# Patient Record
Sex: Female | Born: 1935 | Race: White | Hispanic: No | Smoking: Never smoker
Health system: Southern US, Community
[De-identification: ages and names within clinical notes are randomized; demographics above are authoritative.]

## PROBLEM LIST (undated history)

## (undated) DIAGNOSIS — Z923 Personal history of irradiation: Secondary | ICD-10-CM

## (undated) DIAGNOSIS — D0510 Intraductal carcinoma in situ of unspecified breast: Principal | ICD-10-CM

## (undated) DIAGNOSIS — T4145XA Adverse effect of unspecified anesthetic, initial encounter: Secondary | ICD-10-CM

## (undated) DIAGNOSIS — Z8719 Personal history of other diseases of the digestive system: Secondary | ICD-10-CM

## (undated) DIAGNOSIS — R911 Solitary pulmonary nodule: Secondary | ICD-10-CM

## (undated) DIAGNOSIS — J189 Pneumonia, unspecified organism: Secondary | ICD-10-CM

## (undated) DIAGNOSIS — C3490 Malignant neoplasm of unspecified part of unspecified bronchus or lung: Secondary | ICD-10-CM

## (undated) DIAGNOSIS — M1990 Unspecified osteoarthritis, unspecified site: Secondary | ICD-10-CM

## (undated) DIAGNOSIS — Z8672 Personal history of thrombophlebitis: Secondary | ICD-10-CM

## (undated) DIAGNOSIS — K222 Esophageal obstruction: Secondary | ICD-10-CM

## (undated) DIAGNOSIS — F419 Anxiety disorder, unspecified: Secondary | ICD-10-CM

## (undated) DIAGNOSIS — T8859XA Other complications of anesthesia, initial encounter: Secondary | ICD-10-CM

## (undated) DIAGNOSIS — Z9889 Other specified postprocedural states: Secondary | ICD-10-CM

## (undated) DIAGNOSIS — K219 Gastro-esophageal reflux disease without esophagitis: Secondary | ICD-10-CM

## (undated) DIAGNOSIS — I10 Essential (primary) hypertension: Secondary | ICD-10-CM

## (undated) DIAGNOSIS — J449 Chronic obstructive pulmonary disease, unspecified: Secondary | ICD-10-CM

## (undated) DIAGNOSIS — R112 Nausea with vomiting, unspecified: Secondary | ICD-10-CM

## (undated) DIAGNOSIS — I6529 Occlusion and stenosis of unspecified carotid artery: Secondary | ICD-10-CM

## (undated) DIAGNOSIS — N390 Urinary tract infection, site not specified: Secondary | ICD-10-CM

## (undated) HISTORY — PX: BREAST CYST EXCISION: SHX579

## (undated) HISTORY — DX: Essential (primary) hypertension: I10

## (undated) HISTORY — DX: Personal history of other diseases of the digestive system: Z87.19

## (undated) HISTORY — DX: Personal history of thrombophlebitis: Z86.72

## (undated) HISTORY — PX: KNEE ARTHROSCOPY: SUR90

## (undated) HISTORY — PX: CATARACT EXTRACTION: SUR2

## (undated) HISTORY — DX: Esophageal obstruction: K22.2

## (undated) HISTORY — DX: Chronic obstructive pulmonary disease, unspecified: J44.9

## (undated) HISTORY — DX: Unspecified osteoarthritis, unspecified site: M19.90

## (undated) HISTORY — DX: Intraductal carcinoma in situ of unspecified breast: D05.10

## (undated) HISTORY — DX: Personal history of irradiation: Z92.3

## (undated) HISTORY — PX: CYSTECTOMY: SUR359

## (undated) HISTORY — DX: Gastro-esophageal reflux disease without esophagitis: K21.9

## (undated) HISTORY — DX: Anxiety disorder, unspecified: F41.9

## (undated) HISTORY — PX: EYE SURGERY: SHX253

---

## 2005-12-27 ENCOUNTER — Ambulatory Visit: Payer: Self-pay | Admitting: Internal Medicine

## 2006-01-04 ENCOUNTER — Ambulatory Visit: Payer: Self-pay | Admitting: Internal Medicine

## 2006-02-13 ENCOUNTER — Ambulatory Visit: Payer: Self-pay | Admitting: Internal Medicine

## 2006-05-22 ENCOUNTER — Encounter (INDEPENDENT_AMBULATORY_CARE_PROVIDER_SITE_OTHER): Payer: Self-pay | Admitting: Diagnostic Radiology

## 2006-05-22 ENCOUNTER — Encounter (INDEPENDENT_AMBULATORY_CARE_PROVIDER_SITE_OTHER): Payer: Self-pay | Admitting: Specialist

## 2006-05-22 ENCOUNTER — Encounter: Admission: RE | Admit: 2006-05-22 | Discharge: 2006-05-22 | Payer: Self-pay | Admitting: Internal Medicine

## 2006-06-06 ENCOUNTER — Encounter: Admission: RE | Admit: 2006-06-06 | Discharge: 2006-06-06 | Payer: Self-pay | Admitting: Internal Medicine

## 2006-07-04 ENCOUNTER — Encounter: Admission: RE | Admit: 2006-07-04 | Discharge: 2006-07-04 | Payer: Self-pay | Admitting: Surgery

## 2006-07-04 ENCOUNTER — Ambulatory Visit (HOSPITAL_COMMUNITY): Admission: RE | Admit: 2006-07-04 | Discharge: 2006-07-04 | Payer: Self-pay | Admitting: Surgery

## 2006-07-04 ENCOUNTER — Encounter (INDEPENDENT_AMBULATORY_CARE_PROVIDER_SITE_OTHER): Payer: Self-pay | Admitting: Surgery

## 2006-07-13 ENCOUNTER — Ambulatory Visit: Payer: Self-pay | Admitting: Oncology

## 2006-07-24 ENCOUNTER — Ambulatory Visit: Admission: RE | Admit: 2006-07-24 | Discharge: 2006-09-27 | Payer: Self-pay | Admitting: Radiation Oncology

## 2006-08-09 LAB — COMPREHENSIVE METABOLIC PANEL
ALT: 18 U/L (ref 0–35)
CO2: 24 mEq/L (ref 19–32)
Calcium: 9 mg/dL (ref 8.4–10.5)
Chloride: 106 mEq/L (ref 96–112)
Glucose, Bld: 111 mg/dL — ABNORMAL HIGH (ref 70–99)
Sodium: 141 mEq/L (ref 135–145)
Total Bilirubin: 0.3 mg/dL (ref 0.3–1.2)
Total Protein: 6.8 g/dL (ref 6.0–8.3)

## 2006-08-09 LAB — CBC WITH DIFFERENTIAL/PLATELET
BASO%: 0.8 % (ref 0.0–2.0)
MCHC: 35.3 g/dL (ref 32.0–36.0)
MONO#: 0.6 10*3/uL (ref 0.1–0.9)
RBC: 3.89 10*6/uL (ref 3.70–5.32)
lymph#: 1.9 10*3/uL (ref 0.9–3.3)

## 2006-08-30 ENCOUNTER — Ambulatory Visit: Payer: Self-pay | Admitting: Oncology

## 2006-11-28 ENCOUNTER — Ambulatory Visit: Payer: Self-pay | Admitting: Oncology

## 2007-01-15 ENCOUNTER — Ambulatory Visit: Payer: Self-pay | Admitting: Oncology

## 2007-02-27 ENCOUNTER — Ambulatory Visit: Payer: Self-pay | Admitting: Oncology

## 2007-07-25 ENCOUNTER — Encounter: Admission: RE | Admit: 2007-07-25 | Discharge: 2007-07-25 | Payer: Self-pay | Admitting: Orthopedic Surgery

## 2007-08-07 ENCOUNTER — Encounter: Admission: RE | Admit: 2007-08-07 | Discharge: 2007-08-07 | Payer: Self-pay | Admitting: Orthopedic Surgery

## 2007-12-03 ENCOUNTER — Emergency Department (HOSPITAL_BASED_OUTPATIENT_CLINIC_OR_DEPARTMENT_OTHER): Admission: EM | Admit: 2007-12-03 | Discharge: 2007-12-03 | Payer: Self-pay | Admitting: Emergency Medicine

## 2008-03-30 ENCOUNTER — Telehealth: Payer: Self-pay | Admitting: Internal Medicine

## 2008-05-01 ENCOUNTER — Encounter: Admission: RE | Admit: 2008-05-01 | Discharge: 2008-05-01 | Payer: Self-pay | Admitting: Surgery

## 2008-05-14 DIAGNOSIS — F411 Generalized anxiety disorder: Secondary | ICD-10-CM | POA: Insufficient documentation

## 2008-05-14 DIAGNOSIS — J4489 Other specified chronic obstructive pulmonary disease: Secondary | ICD-10-CM | POA: Insufficient documentation

## 2008-05-14 DIAGNOSIS — K222 Esophageal obstruction: Secondary | ICD-10-CM

## 2008-05-14 DIAGNOSIS — Z8719 Personal history of other diseases of the digestive system: Secondary | ICD-10-CM

## 2008-05-14 DIAGNOSIS — J449 Chronic obstructive pulmonary disease, unspecified: Secondary | ICD-10-CM

## 2008-05-14 DIAGNOSIS — K219 Gastro-esophageal reflux disease without esophagitis: Secondary | ICD-10-CM

## 2008-05-14 DIAGNOSIS — M810 Age-related osteoporosis without current pathological fracture: Secondary | ICD-10-CM | POA: Insufficient documentation

## 2008-05-14 DIAGNOSIS — M129 Arthropathy, unspecified: Secondary | ICD-10-CM | POA: Insufficient documentation

## 2008-05-14 DIAGNOSIS — I10 Essential (primary) hypertension: Secondary | ICD-10-CM

## 2008-05-14 DIAGNOSIS — I809 Phlebitis and thrombophlebitis of unspecified site: Secondary | ICD-10-CM

## 2008-05-18 ENCOUNTER — Ambulatory Visit: Payer: Self-pay | Admitting: Internal Medicine

## 2008-07-01 ENCOUNTER — Encounter: Payer: Self-pay | Admitting: Internal Medicine

## 2009-05-05 ENCOUNTER — Encounter: Admission: RE | Admit: 2009-05-05 | Discharge: 2009-05-05 | Payer: Self-pay | Admitting: Surgery

## 2009-12-19 ENCOUNTER — Emergency Department (HOSPITAL_BASED_OUTPATIENT_CLINIC_OR_DEPARTMENT_OTHER): Admission: EM | Admit: 2009-12-19 | Discharge: 2009-12-19 | Payer: Self-pay | Admitting: Emergency Medicine

## 2010-04-12 LAB — BASIC METABOLIC PANEL
BUN: 15 mg/dL (ref 6–23)
CO2: 27 mEq/L (ref 19–32)
Chloride: 103 mEq/L (ref 96–112)
GFR calc Af Amer: >60 mL/min (ref >60)
Glucose, Bld: 106 mg/dL — ABNORMAL HIGH (ref 70–99)
Potassium: 3.5 mEq/L (ref 3.5–5.1)
Sodium: 143 mEq/L (ref 135–145)

## 2010-04-12 LAB — DIFFERENTIAL
Basophils Relative: 1 % (ref 0–1)
Lymphocytes Relative: 27 % (ref 12–46)
Monocytes Absolute: 0.6 10*3/uL (ref 0.1–1.0)
Monocytes Relative: 10 % (ref 3–12)
Neutrophils Relative %: 60 % (ref 43–77)

## 2010-04-12 LAB — URINALYSIS, ROUTINE W REFLEX MICROSCOPIC
Bilirubin Urine: NEGATIVE
Glucose, UA: NEGATIVE mg/dL
Leukocytes, UA: NEGATIVE
Nitrite: NEGATIVE
Protein, ur: NEGATIVE mg/dL
pH: 7 (ref 5.0–8.0)

## 2010-04-12 LAB — CBC
MCHC: 34.6 g/dL (ref 30.0–36.0)
Platelets: 224 10*3/uL (ref 150–400)
RDW: 13.3 % (ref 11.5–15.5)

## 2010-04-12 LAB — URINE MICROSCOPIC-ADD ON

## 2010-04-12 LAB — POCT CARDIAC MARKERS: CKMB, poc: <1 ng/mL — ABNORMAL LOW (ref 1.0–8.0)

## 2010-05-10 ENCOUNTER — Other Ambulatory Visit: Payer: Self-pay | Admitting: Internal Medicine

## 2010-05-10 DIAGNOSIS — Z1231 Encounter for screening mammogram for malignant neoplasm of breast: Secondary | ICD-10-CM

## 2010-05-12 ENCOUNTER — Other Ambulatory Visit: Payer: Self-pay | Admitting: Internal Medicine

## 2010-05-20 ENCOUNTER — Other Ambulatory Visit: Payer: Self-pay | Admitting: Internal Medicine

## 2010-05-20 MED ORDER — ESOMEPRAZOLE MAGNESIUM 40 MG PO CPDR: 40.0000 mg | DELAYED_RELEASE_CAPSULE | Freq: Every day | ORAL | Status: DC

## 2010-05-20 NOTE — Telephone Encounter (Signed)
Rx sent 

## 2010-05-26 ENCOUNTER — Ambulatory Visit
Admission: RE | Admit: 2010-05-26 | Discharge: 2010-05-26 | Disposition: A | Discharge status: Home / Self Care | Payer: Medicare Other | Source: Ambulatory Visit | Attending: Internal Medicine | Admitting: Internal Medicine

## 2010-05-26 DIAGNOSIS — Z1231 Encounter for screening mammogram for malignant neoplasm of breast: Secondary | ICD-10-CM

## 2010-06-07 ENCOUNTER — Encounter (INDEPENDENT_AMBULATORY_CARE_PROVIDER_SITE_OTHER): Payer: Self-pay | Admitting: Surgery

## 2010-06-14 NOTE — Op Note (Signed)
NAME:  [name], [name]              ACCOUNT NO.:  [id number]   MEDICAL RECORD NO.:  [id number]          PATIENT TYPE:  AMB   LOCATION:  SDS                          FACILITY:  [facility]   PHYSICIAN:  Wilmon Arms. Corliss Skains, M.D. DATE OF BIRTH:  1929/06/20   DATE OF PROCEDURE:  07/04/2006  DATE OF DISCHARGE:                               OPERATIVE REPORT   PREOPERATIVE DIAGNOSIS:  1. Left breast ductal carcinoma in situ.  2. Right breast atypical cells.   POSTOPERATIVE DIAGNOSIS:  1. Left breast ductal carcinoma in situ.  2. Right breast atypical cells.   PROCEDURE PERFORMED:  Bilateral needle localized partial mastectomy.   SURGEON:  Wilmon Arms. Tsuei, M.D.   ANESTHESIA:  General.   INDICATIONS:  The patient is a healthy 77 year old female with a strong  family history of breast cancer.  The patient had mammograms this year  which had bilateral suspicious findings.  She underwent bilateral  stereotactic biopsies.  The left breast biopsy showed ductal carcinoma  in situ, ER and PR positive.  The right breast biopsy showed a complex  sclerosing lesion with microcalcifications with atypia.  The MRI showed  no other significant findings.  She now presents for bilateral needle  localized lumpectomies.   DESCRIPTION OF PROCEDURE:  The patient was brought to the operating room  and placed in the supine position on the operating table.  Wires had  been placed by radiology preop.  After an adequate level of general  anesthesia was obtained, the patient's breasts were prepped with  Betadine and draped in a sterile fashion.  Her bed was tilted to her  right.  The left wire was placed in the far lateral position.  An  elliptical incision was made around the wire.  Skin flaps were raised.  We had to tunnel deeply towards the center of the breast to follow the  wire.  A large cylinder of tissue was taken around the wire.  Once we  were safely past the tip of the wire, the specimen was amputated  and  oriented with a long suture lateral and a short suture superior.  The  specimen was sent for specimen mammogram.  The wound was packed with a  sponge.   We then turned our attention to the right side.  The patient was tilted  to her left.  An elliptical incision was made around the wire and skin  flaps were raised with cautery.  We removed a cylinder of tissue around  the wire down to the fascia.  At this point, we received a telephone  report that the left sided specimen mammogram showed the biopsy clip in  place.  Once we had removed the right sided specimen, it was oriented  with a long suture lateral and a short suture superior.  Hemostasis was  obtained with cautery.  This wound was also packed with a sponge.  I  then closed the left sided incision while we were waiting for the phone  report on the right.  The wound was closed with a deep layer of 3-0  Vicryl and a subcuticular layer  of 4-0 Monocryl.  The right sided lesion  was closed in a similar fashion.  We then received a telephone report that the right specimen also showed  the biopsy clip in the middle.  Steri-Strips and clean dressings were  applied.  The patient was extubated and brought to recovery in stable  condition.  All sponge, instrument, and needle counts were correct.      Wilmon Arms. Tsuei, M.D.  Electronically Signed     MKT/MEDQ  D:  07/04/2006  T:  07/04/2006  Job:  [id number]

## 2010-06-17 NOTE — Assessment & Plan Note (Signed)
[facility]                         GASTROENTEROLOGY OFFICE NOTE   [name], [name]                       MRN:          [id number]  DATE:12/27/2005                            DOB:          1930/01/15    [name] is a delightful 77 year old white female who is here today  with progressive solid food dysphagia.  She has for the past 3 to 4  months noticed difficulty in swallowing, especially steaks and meat.  She had one episode of having to run to the bathroom to regurgitate the  food which was stuck in her esophagus.  She denies any dysphagia to  liquids.  She denies odynophagia or hematemesis.  The patient has a  history of benign distal esophageal stricture which was dilated on one  previous occasion in 2004 by Dr. Bascom Levels in [facility].  She feels that  she is due for repeat dilatation.  She also has a history of  diverticulosis, flexible sigmoidoscopy and barium enema about 3 years  ago, was unremarkable.   PAST MEDICAL HISTORY:  1. High blood pressure.  2. Arthritis.  3. Allergies.   OPERATIONS:  None.   FAMILY HISTORY:  Uterine cancer in sister, pancreatic cancer in husband,  breast cancer in two sisters.   SOCIAL HISTORY:  She is widowed.  She works as a Nutritional therapist.  She does not smoke and drinks a glass of wine a day.   REVIEW OF SYSTEMS:  Positive for eyeglasses, allergies, frequent cough,  urination changes, arthritic complaints, back pain, leakage of urine.   PHYSICAL EXAMINATION:  Blood pressure 194/62, pulse 112, weight 111  pounds.  She was alert, oriented, in no distress, somewhat nervous.  HEENT:  Sclerae is nonicteric.  She has spider nevi in her face.  LUNGS:  Clear to auscultation.  CARDIAC:  Cor with rapid S1 and S2, no murmur.  ABDOMEN:  Soft, nontender with normal active bowel sounds, no  tenderness.  Liver edge at the costal margin, no ascites.  RECTAL:  Soft, Hemoccult-negative stool.  EXTREMITIES:   No edema.   IMPRESSION:  [age]-year-old white female with progressive solid  food dysphagia suggestive of recurrent distal esophageal stricture, rule  out Barrett's esophagus.  The patient had previous dilatation 4 years  ago.   PLAN:  1. Switch from Protonix to Nexium 40 mg a day at patient's request.  2. Schedule upper endoscopy with possible esophageal dilation.  3. Antireflux measures.     Hedwig Morton. Juanda Chance, MD  Electronically Signed    DMB/MedQ  DD: 12/27/2005  DT: 12/28/2005  Job #: [id number]   cc:   Newt Minion

## 2010-06-17 NOTE — Assessment & Plan Note (Signed)
 [facility]                         GASTROENTEROLOGY OFFICE NOTE   [name], [name]                       MRN:          [id number]  DATE:02/13/2006                            DOB:          [birth year]    [name] is a 77 year old white female found to have a benign distal  esophageal stricture on upper endoscopy on January 04, 2006.  She  underwent upper endoscopy with esophageal dilatation to 16 mm or 48  French using Savary dilators.  She was then switched to Nexium 40 mg a  day.  She is currently quite satisfied with her progress.  There has  been no dysphagia to solids or liquids.  Nexium seems to be controlling  her symptoms of reflux.  She still has occasional dry cough.   PHYSICAL EXAMINATION:  Blood pressure 150/78, pulse 72, weight 112  pounds.   We have discussed treatment of gastroesophageal reflux.  She is getting  adequate therapy combined with antireflux measures.  She may not need  esophageal dilatation again.  I have written her a prescription for  Nexium 90-day supply with 3 refills.  I will be happy to see her in the  future for other GI issues.  She is up to date on flexible sigmoidoscopy  and barium enema, which was done in the past few years.     Hedwig Morton. Juanda Chance, MD  Electronically Signed    DMB/MedQ  DD: 02/13/2006  DT: 02/14/2006  Job #: [id number]   cc:   Newt Minion

## 2010-07-06 ENCOUNTER — Encounter: Payer: Self-pay | Admitting: Internal Medicine

## 2010-07-06 ENCOUNTER — Ambulatory Visit (INDEPENDENT_AMBULATORY_CARE_PROVIDER_SITE_OTHER): Payer: Medicare Other | Admitting: Internal Medicine

## 2010-07-06 VITALS — BP 162/78 | HR 96 | Ht 60.0 in | Wt 106.2 lb

## 2010-07-06 DIAGNOSIS — K219 Gastro-esophageal reflux disease without esophagitis: Secondary | ICD-10-CM

## 2010-07-06 DIAGNOSIS — Z1211 Encounter for screening for malignant neoplasm of colon: Secondary | ICD-10-CM

## 2010-07-06 MED ORDER — ESOMEPRAZOLE MAGNESIUM 40 MG PO CPDR: 40.0000 mg | DELAYED_RELEASE_CAPSULE | Freq: Every day | ORAL | Status: DC

## 2010-07-06 NOTE — Progress Notes (Signed)
[name] 10-28-1929 MRN [id number]      History of Present Illness:  This is a 77 year old white female with chronic gastroesophageal reflux and an esophageal stricture. She is here for a refill of her Nexium. She is asymptomatic. Her last upper endoscopy with dilatation was in December 2007 using 16 mm Savary dilators. She was in the past on Prevacid and Protonix. She had a flexible sigmoidoscopy and subsequent barium enema in 2000. She is due for a recall colonoscopy. She denies any lower GI symptoms.   Past Medical History  Diagnosis Date  . History of gastritis   . History of phlebitis   . Anxiety disorder   . Osteoporosis   . COPD (chronic obstructive pulmonary disease)   . Arthritis   . Hypertension   . GERD (gastroesophageal reflux disease)   . Esophageal stricture    Past Surgical History  Procedure Date  . Cataract extraction     bilateral  . Knee arthroscopy     right  . Breast cyst excision     left    reports that she has never smoked. She has never used smokeless tobacco. She reports that she drinks alcohol. She reports that she does not use illicit drugs. family history includes Breast cancer in her sister and Uterine cancer in an unspecified family member.  There is no history of Colon cancer. Allergies  Allergen Reactions  . Propoxyphene N-Acetaminophen   . Sulfonamide Derivatives   . Sulfur         Review of Systems: Negative for dysphagia or odynophagia. Negative for chest pain or shortness of breath  The remainder of the 10  point ROS is negative except as outlined in H&P    Assessment and Plan:  Problem #1 gastroesophageal reflux disease. Patient is asymptomatic and well-controlled on Nexium 40 mg daily. She is to continue antireflux measures.  Problem #2 colorectal screening. Patient is due for a colonoscopy. We will send a recall letter in the fall of 2012.   07/06/2010 Lina Sar

## 2010-07-06 NOTE — Patient Instructions (Addendum)
We have sent Nexium prescription to your pharmacy. CC:Dr Guerry Bruin

## 2010-11-01 LAB — RAPID STREP SCREEN (MED CTR MEBANE ONLY): Streptococcus, Group A Screen (Direct): NEGATIVE

## 2011-05-09 ENCOUNTER — Other Ambulatory Visit (INDEPENDENT_AMBULATORY_CARE_PROVIDER_SITE_OTHER): Payer: Self-pay | Admitting: Surgery

## 2011-05-09 DIAGNOSIS — Z1231 Encounter for screening mammogram for malignant neoplasm of breast: Secondary | ICD-10-CM

## 2011-05-12 ENCOUNTER — Encounter (INDEPENDENT_AMBULATORY_CARE_PROVIDER_SITE_OTHER): Payer: Self-pay | Admitting: Surgery

## 2011-05-12 ENCOUNTER — Ambulatory Visit (INDEPENDENT_AMBULATORY_CARE_PROVIDER_SITE_OTHER): Payer: Medicare Other | Admitting: Surgery

## 2011-05-12 VITALS — BP 150/94 | HR 86 | Temp 98.6°F | Resp 22 | Ht 60.0 in | Wt 107.2 lb

## 2011-05-12 DIAGNOSIS — D0510 Intraductal carcinoma in situ of unspecified breast: Secondary | ICD-10-CM

## 2011-05-12 DIAGNOSIS — D0590 Unspecified type of carcinoma in situ of unspecified breast: Secondary | ICD-10-CM

## 2011-05-12 HISTORY — DX: Intraductal carcinoma in situ of unspecified breast: D05.10

## 2011-05-12 NOTE — Progress Notes (Signed)
The patient is here for annual follow-up of her left breast DCIS.  She is status post bilateral needle-localized lumpectomies in 2008.  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  She completed radiation therapy and was unable to tolerate Tamoxifen.  She is due for a mammogram in a couple of weeks.  No changes to her health status.  Filed Vitals:   05/12/11 1454  BP: 150/94  Pulse: 86  Temp: 98.6 F (37 C)  Resp: 22   Breasts - both incisions are faint and barely visible; no palpable masses in either breast.  No axillary lymphadenopathy.  Bilateral nipples are chronically retracted and these are unchanged.  We will continue with annual mammograms and annual breast examinations.  Follow-up 1 year.  Wilmon Arms. Corliss Skains, MD, [facility] Surgery  05/12/2011 3:32 PM

## 2011-05-29 ENCOUNTER — Other Ambulatory Visit (INDEPENDENT_AMBULATORY_CARE_PROVIDER_SITE_OTHER): Payer: Self-pay | Admitting: Surgery

## 2011-05-29 ENCOUNTER — Ambulatory Visit
Admission: RE | Admit: 2011-05-29 | Discharge: 2011-05-29 | Disposition: A | Discharge status: Home / Self Care | Payer: Medicare Other | Source: Ambulatory Visit | Attending: Surgery | Admitting: Surgery

## 2011-05-29 DIAGNOSIS — Z1231 Encounter for screening mammogram for malignant neoplasm of breast: Secondary | ICD-10-CM

## 2011-07-15 ENCOUNTER — Other Ambulatory Visit: Payer: Self-pay | Admitting: Internal Medicine

## 2012-04-10 ENCOUNTER — Encounter (INDEPENDENT_AMBULATORY_CARE_PROVIDER_SITE_OTHER): Payer: Self-pay | Admitting: Surgery

## 2012-05-27 ENCOUNTER — Other Ambulatory Visit (INDEPENDENT_AMBULATORY_CARE_PROVIDER_SITE_OTHER): Payer: Self-pay | Admitting: Surgery

## 2012-05-27 DIAGNOSIS — Z853 Personal history of malignant neoplasm of breast: Secondary | ICD-10-CM

## 2012-05-31 ENCOUNTER — Encounter (INDEPENDENT_AMBULATORY_CARE_PROVIDER_SITE_OTHER): Payer: Self-pay | Admitting: Surgery

## 2012-05-31 ENCOUNTER — Ambulatory Visit (INDEPENDENT_AMBULATORY_CARE_PROVIDER_SITE_OTHER): Payer: Medicare Other | Admitting: Surgery

## 2012-05-31 VITALS — BP 134/74 | HR 97 | Temp 98.6°F | Resp 18 | Ht 62.0 in | Wt 110.0 lb

## 2012-05-31 DIAGNOSIS — D0590 Unspecified type of carcinoma in situ of unspecified breast: Secondary | ICD-10-CM

## 2012-05-31 DIAGNOSIS — D0512 Intraductal carcinoma in situ of left breast: Secondary | ICD-10-CM

## 2012-05-31 NOTE — Progress Notes (Signed)
The patient is here for annual follow-up of her left breast DCIS. She is status post bilateral needle-localized lumpectomies in 2008. 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 She completed radiation therapy and was unable to tolerate Tamoxifen. She is due for a mammogram next week.   No changes to her health status.                     Filed Vitals:   05/31/12 0957  BP: 134/74  Pulse: 97  Temp: 98.6 F (37 C)  Resp: 18    Breasts - both incisions are faint and barely visible; no palpable masses in either breast. No axillary lymphadenopathy. Bilateral nipples are chronically retracted and these are unchanged.  We will continue with annual mammograms and annual breast examinations.  Follow-up 1 year.  Wilmon Arms. Corliss Skains, MD, [facility] Surgery  05/31/2012 10:39 AM

## 2012-06-07 ENCOUNTER — Ambulatory Visit
Admission: RE | Admit: 2012-06-07 | Discharge: 2012-06-07 | Disposition: A | Discharge status: Home / Self Care | Payer: Medicare Other | Source: Ambulatory Visit | Attending: Surgery | Admitting: Surgery

## 2012-06-07 DIAGNOSIS — Z853 Personal history of malignant neoplasm of breast: Secondary | ICD-10-CM

## 2012-07-12 ENCOUNTER — Other Ambulatory Visit: Payer: Self-pay | Admitting: Internal Medicine

## 2012-08-13 ENCOUNTER — Other Ambulatory Visit: Payer: Self-pay | Admitting: Dermatology

## 2013-06-02 ENCOUNTER — Encounter (INDEPENDENT_AMBULATORY_CARE_PROVIDER_SITE_OTHER): Payer: Self-pay | Admitting: Surgery

## 2013-06-10 ENCOUNTER — Other Ambulatory Visit (INDEPENDENT_AMBULATORY_CARE_PROVIDER_SITE_OTHER): Payer: Self-pay | Admitting: Surgery

## 2013-06-10 DIAGNOSIS — Z853 Personal history of malignant neoplasm of breast: Secondary | ICD-10-CM

## 2013-06-13 ENCOUNTER — Other Ambulatory Visit: Payer: Self-pay

## 2013-06-13 ENCOUNTER — Other Ambulatory Visit (INDEPENDENT_AMBULATORY_CARE_PROVIDER_SITE_OTHER): Payer: Self-pay | Admitting: Surgery

## 2013-06-13 DIAGNOSIS — Z853 Personal history of malignant neoplasm of breast: Secondary | ICD-10-CM

## 2013-06-19 ENCOUNTER — Ambulatory Visit
Admission: RE | Admit: 2013-06-19 | Discharge: 2013-06-19 | Disposition: A | Discharge status: Home / Self Care | Payer: Medicare Other | Source: Ambulatory Visit | Attending: Surgery | Admitting: Surgery

## 2013-06-19 ENCOUNTER — Encounter (INDEPENDENT_AMBULATORY_CARE_PROVIDER_SITE_OTHER): Payer: Self-pay

## 2013-06-19 DIAGNOSIS — Z853 Personal history of malignant neoplasm of breast: Secondary | ICD-10-CM

## 2013-07-11 ENCOUNTER — Encounter (INDEPENDENT_AMBULATORY_CARE_PROVIDER_SITE_OTHER): Payer: Self-pay | Admitting: Surgery

## 2013-07-11 ENCOUNTER — Ambulatory Visit (INDEPENDENT_AMBULATORY_CARE_PROVIDER_SITE_OTHER): Payer: Medicare Other | Admitting: Surgery

## 2013-07-11 VITALS — BP 143/72 | HR 76 | Temp 97.7°F | Resp 18 | Ht 60.0 in | Wt 110.8 lb

## 2013-07-11 DIAGNOSIS — D0510 Intraductal carcinoma in situ of unspecified breast: Secondary | ICD-10-CM

## 2013-07-11 DIAGNOSIS — D0590 Unspecified type of carcinoma in situ of unspecified breast: Secondary | ICD-10-CM

## 2013-07-11 NOTE — Progress Notes (Signed)
The patient is here for annual follow-up of her left breast DCIS. She is status post bilateral needle-localized lumpectomies in 2008. 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 She completed radiation therapy and was unable to tolerate Tamoxifen. Mammogram in May was normal.    No changes to her health status.   Filed Vitals:   07/11/13 0951  BP: 143/72  Pulse: 76  Temp: 97.7 F (36.5 C)  Resp: 18   Mm Digital Diagnostic Bilat  06/19/2013   CLINICAL DATA:  Seventh year of post lumpectomy diagnostic mammography. No current problems.  EXAM: DIGITAL DIAGNOSTIC  BILATERAL MAMMOGRAM WITH CAD  COMPARISON:  Multiple prior exams  ACR Breast Density Category c: The breast tissue is heterogeneously dense, which may obscure small masses.  FINDINGS: Dystrophic calcification and mild architectural distortion in the upper outer left breast, reflecting the previous lumpectomy site, is stable. There are no discrete masses, other areas of architectural distortion or suspicious calcifications. No change from the recent prior studies.  Mammographic images were processed with CAD.  IMPRESSION: No evidence of new or recurrent malignancy. Benign left breast post lumpectomy changes.  RECOMMENDATION: Screening mammogram in one year.(Code:SM-B-01Y)  I have discussed the findings and recommendations with the patient. Results were also provided in writing at the conclusion of the visit. If applicable, a reminder letter will be sent to the patient regarding the next appointment.  BI-RADS CATEGORY  2: Benign Finding(s)   Electronically Signed   By: Lajean Manes M.D.   On: 06/19/2013 14:17     Breasts - both incisions are faint and barely visible; no palpable masses in either breast. No axillary lymphadenopathy. Bilateral nipples are chronically retracted and these are unchanged.  We will continue with annual mammograms and annual breast examinations.  Follow-up 1 year.  Imogene Burn.  Georgette Dover, MD, [facility] Surgery  General/ Trauma Surgery  07/11/2013 10:11 AM

## 2013-09-01 ENCOUNTER — Other Ambulatory Visit: Payer: Self-pay | Admitting: Ophthalmology

## 2015-02-26 ENCOUNTER — Other Ambulatory Visit: Payer: Self-pay

## 2015-02-26 DIAGNOSIS — Z1231 Encounter for screening mammogram for malignant neoplasm of breast: Secondary | ICD-10-CM

## 2015-03-18 ENCOUNTER — Ambulatory Visit
Admission: RE | Admit: 2015-03-18 | Discharge: 2015-03-18 | Disposition: A | Discharge status: Home / Self Care | Payer: Medicare Other | Source: Ambulatory Visit

## 2015-03-18 DIAGNOSIS — Z1231 Encounter for screening mammogram for malignant neoplasm of breast: Secondary | ICD-10-CM

## 2016-09-04 ENCOUNTER — Telehealth: Payer: Self-pay | Admitting: Internal Medicine

## 2016-09-04 ENCOUNTER — Other Ambulatory Visit (INDEPENDENT_AMBULATORY_CARE_PROVIDER_SITE_OTHER): Payer: Medicare Other

## 2016-09-04 ENCOUNTER — Ambulatory Visit (INDEPENDENT_AMBULATORY_CARE_PROVIDER_SITE_OTHER): Payer: Medicare Other | Admitting: Internal Medicine

## 2016-09-04 ENCOUNTER — Encounter: Payer: Self-pay | Admitting: Internal Medicine

## 2016-09-04 VITALS — BP 170/60 | HR 90 | Ht 60.0 in | Wt 102.8 lb

## 2016-09-04 DIAGNOSIS — R918 Other nonspecific abnormal finding of lung field: Secondary | ICD-10-CM | POA: Insufficient documentation

## 2016-09-04 DIAGNOSIS — I10 Essential (primary) hypertension: Secondary | ICD-10-CM | POA: Diagnosis not present

## 2016-09-04 LAB — CBC WITH DIFFERENTIAL/PLATELET
BASOS ABS: 0.1 10*3/uL (ref 0.0–0.1)
BASOS PCT: 1.4 % (ref 0.0–3.0)
Eosinophils Absolute: 0.3 10*3/uL (ref 0.0–0.7)
Eosinophils Relative: 3.6 % (ref 0.0–5.0)
HCT: 36 % (ref 36.0–46.0)
Hemoglobin: 12.2 g/dL (ref 12.0–15.0)
Lymphocytes Relative: 24.9 % (ref 12.0–46.0)
Lymphs Abs: 1.8 10*3/uL (ref 0.7–4.0)
MCHC: 33.9 g/dL (ref 30.0–36.0)
MCV: 89.9 fl (ref 78.0–100.0)
MONOS PCT: 7.6 % (ref 3.0–12.0)
Monocytes Absolute: 0.5 10*3/uL (ref 0.1–1.0)
NEUTROS ABS: 4.4 10*3/uL (ref 1.4–7.7)
NEUTROS PCT: 62.5 % (ref 43.0–77.0)
PLATELETS: 211 10*3/uL (ref 150.0–400.0)
RBC: 4.01 Mil/uL (ref 3.87–5.11)
RDW: 13.7 % (ref 11.5–15.5)
WBC: 7 10*3/uL (ref 4.0–10.5)

## 2016-09-04 LAB — SEDIMENTATION RATE: Sed Rate: 15 mm/hr (ref 0–30)

## 2016-09-04 NOTE — Patient Instructions (Signed)
Please remember to go to the  x-ray department downstairs in the basement  for your tests - we will call you with the results when they are available and decide next step      We will need your records from [facility] from when you were treated for pneumonia

## 2016-09-04 NOTE — Progress Notes (Signed)
Subjective:     Patient ID: [name], female   DOB: 1929/07/17,    MRN: [id number]  HPI   29 yowf never smoker  developed new cough x 2016 p apparent CAP during that time got a cxr at [facility] Dr Waldon Reining that she was told was normal but cough persisted esp in am p stirring no production assoc clear nasal discharge and some doe x [place] > new physician eval late July 2018 > echo ordered > CT 08/29/16 c/w RUL medial opacity and self- referred to pulmonary clinic 09/04/2016  p declined seeing [facility]     09/04/2016 1st [facility] office visit/ [name]   Chief Complaint  Patient presents with  . Pulmonary Consult    Self referral for abnormal ct chest. Pt c/o non prod cough for years.   says if anything the cough has improved ? Since d/c ACEi ? When and has no h/o excess/ purulent sputum or mucus plugs  Or hemoptysis.  Has passive smoke exp hx at office and home but this was all quite remote.  No cp / night sweates/ fever chills or unintended wt loss  No obvious   day to day or daytime variability or assoc excess/ purulent sputum or obvious  chest tightness, subjective wheeze or overt sinus or HB/reflux  symptoms. No unusual exp hx or h/o childhood pna/ asthma or knowledge of premature birth.  Sleeping ok without nocturnal  or early am exacerbation of respiratory  c/o's or need for noct saba. Also denies any obvious fluctuation of symptoms with weather or environmental changes or other aggravating or alleviating factors except as outlined above.  Current Medications, Allergies, Complete Past Medical History, Past Surgical History, Family History, and Social History were reviewed in [employer] record.  ROS  The following are not active complaints unless bolded sore throat, hoarseness, dysphagia, dental problems, itching, sneezing,  nasal congestion or excess/ purulent secretions, ear aches,   fever, chills, sweats, unintended wt loss or wt gain, classically  pleuritic or exertional cp,  orthopnea pnd or leg swelling, presyncope, palpitations, abdominal pain, anorexia, nausea, vomiting, diarrhea  or change in bowel habits, change in stools or urine, dysuria,hematuria,  rash, arthralgias, visual complaints, headache, numbness, weakness or ataxia or problems with walking or coordination,  change in mood/affect or memory.         Review of Systems     Objective:   Physical Exam    anxious but very pleasant amb wf nad  Wt Readings from Last 3 Encounters:  09/04/16 102 lb 12.8 oz (46.6 kg)  07/11/13 110 lb 12.8 oz (50.3 kg)  05/31/12 110 lb (49.9 kg)    Vital signs reviewed  - Note on arrival 02 sats  99% on RA and bp 170/90   HEENT: nl dentition, turbinates bilaterally, and oropharynx. Nl external ear canals without cough reflex   NECK :  without JVD/Nodes/TM/ nl carotid upstrokes bilaterally   LUNGS: no acc muscle use,  Nl contour chest which is clear to A and P bilaterally without cough on insp or exp maneuvers   CV:  RRR  no s3 or murmur or increase in P2, and no edema   ABD:  soft and nontender with nl inspiratory excursion in the supine position. No bruits or organomegaly appreciated, bowel sounds nl  MS:  Nl gait/ ext warm without deformities, calf tenderness, cyanosis or clubbing No obvious joint restrictions   SKIN: warm and dry without lesions  NEURO:  alert, approp, nl sensorium with  no motor or cerebellar deficits apparent.      I personally reviewed  Report only : radiology impression as follows:  Chest CT 08/29/16 RUL medial parenchymal opacity measuring  5.3 x 2.8 x 2.6 cm      Assessment:

## 2016-09-04 NOTE — Assessment & Plan Note (Signed)
Not optimally controlled and note was previously on ACEi but I suspect it was stopped for the cough which has actually improved   For reasons that may related to vascular permability and nitric oxide pathways but not elevated  bradykinin levels (as seen with  ACEi use) losartan in the generic form has been reported now from mulitple sources  to cause a similar pattern of non-specific  upper airway symptoms as seen with acei.   This has not been reported with exposure to the other ARB's to date, so it seems reasonable for now to try generic   avapro if ARB needed or use an alternative class altogether but will defer this to [facility] See:  [place]  2008: 101: p [phone]   In any case,  Even though  in retrospect it may not be clear the ACEi contributed to the pt's symptoms,  Pt improved off them and adding them back at this point or in the future would risk confusion in interpretation of non-specific respiratory symptoms to which this patient is prone  ie  Better not to muddy the waters here.

## 2016-09-04 NOTE — Assessment & Plan Note (Addendum)
Pneumonia dx by [facility] around 2016 but cxr's not available and coughed chronically ? While on acei?  - Quant [facility] TB 09/04/2016 >>>   ddx includes area of prev pna with scarring vs granulomatous/ inflammatory lesion vs lung ca so first step is to compare present cxr and consider fob with bx vs PET refer directly to surgery if resectable and hot on the PET.  Discussed in detail all the  indications, usual  risks and alternatives  relative to the benefits with patient who agrees to proceed with w/u  as outlined     Total time devoted to counseling  > 50 % of initial 60 min office visit:  review case with pt/ discussion of options/alternatives/ personally creating written customized instructions  in presence of pt  then going over those specific  Instructions directly with the pt including how to use all of the meds but in particular covering each new medication in detail and the difference between the maintenance= "automatic" meds and the prns using an action plan format for the latter (If this problem/symptom => do that organization reading Left to right).  Please see AVS from this visit for a full list of these instructions which I personally wrote for this pt and  are unique to this visit.

## 2016-09-04 NOTE — Telephone Encounter (Signed)
Received an envelope and CXR disk from [facility] folder up front. Contained the following letter:   1. Information from [facility] in envelope we received today. 09/04/16 2. [facility] 1st visit 10/24/14, Had blood work, EKG, and chest xray on 08/04/16.  3. [name] has sporadic coughing spells and uses cough drops and water to help. She usually coughs after getting up. After the coughing spells, her nose runs a little bit.  For her first visit at [facility], she saw Ron Agee PA, who left. She then started seeing Hubbard Robinson PA.   I will place the folder, letter, and CXR disk in MW's look-at area.

## 2016-09-05 ENCOUNTER — Encounter: Payer: Self-pay | Admitting: Internal Medicine

## 2016-09-05 DIAGNOSIS — D0590 Unspecified type of carcinoma in situ of unspecified breast: Secondary | ICD-10-CM | POA: Insufficient documentation

## 2016-09-05 NOTE — Telephone Encounter (Signed)
Reviewed - xrays prior to 07/2016 per [facility]

## 2016-09-06 LAB — QUANTIFERON TB GOLD ASSAY (BLOOD)
INTERFERON GAMMA RELEASE ASSAY: NEGATIVE
Mitogen-Nil: 6.3 IU/mL
Quantiferon Nil Value: 0.08 IU/mL

## 2016-09-07 ENCOUNTER — Other Ambulatory Visit: Payer: Self-pay | Admitting: *Deleted

## 2016-09-07 DIAGNOSIS — J181 Lobar pneumonia, unspecified organism: Secondary | ICD-10-CM

## 2016-09-07 DIAGNOSIS — R918 Other nonspecific abnormal finding of lung field: Secondary | ICD-10-CM

## 2016-09-15 ENCOUNTER — Ambulatory Visit (HOSPITAL_COMMUNITY)
Admission: RE | Admit: 2016-09-15 | Discharge: 2016-09-15 | Disposition: A | Discharge status: Home / Self Care | Payer: Medicare Other | Source: Ambulatory Visit | Attending: Internal Medicine | Admitting: Internal Medicine

## 2016-09-15 DIAGNOSIS — J181 Lobar pneumonia, unspecified organism: Secondary | ICD-10-CM | POA: Insufficient documentation

## 2016-09-15 DIAGNOSIS — M47816 Spondylosis without myelopathy or radiculopathy, lumbar region: Secondary | ICD-10-CM | POA: Insufficient documentation

## 2016-09-15 DIAGNOSIS — R918 Other nonspecific abnormal finding of lung field: Secondary | ICD-10-CM | POA: Insufficient documentation

## 2016-09-15 DIAGNOSIS — I2510 Atherosclerotic heart disease of native coronary artery without angina pectoris: Secondary | ICD-10-CM | POA: Diagnosis not present

## 2016-09-15 DIAGNOSIS — N261 Atrophy of kidney (terminal): Secondary | ICD-10-CM | POA: Insufficient documentation

## 2016-09-15 DIAGNOSIS — I700 Atherosclerosis of aorta: Secondary | ICD-10-CM | POA: Insufficient documentation

## 2016-09-15 DIAGNOSIS — M5136 Other intervertebral disc degeneration, lumbar region: Secondary | ICD-10-CM | POA: Diagnosis not present

## 2016-09-15 LAB — GLUCOSE, CAPILLARY: GLUCOSE-CAPILLARY: 102 mg/dL — AB (ref 65–99)

## 2016-09-15 MED ORDER — FLUDEOXYGLUCOSE F - 18 (FDG) INJECTION
4.9600 | Freq: Once | INTRAVENOUS | Status: AC | PRN
  Administered 2016-09-15: 4.96 via INTRAVENOUS

## 2016-09-18 ENCOUNTER — Other Ambulatory Visit: Payer: Self-pay | Admitting: Internal Medicine

## 2016-09-18 DIAGNOSIS — R918 Other nonspecific abnormal finding of lung field: Secondary | ICD-10-CM

## 2016-09-18 NOTE — Progress Notes (Signed)
Referral was sent to [facility] for [facility]

## 2016-09-19 ENCOUNTER — Telehealth: Payer: Self-pay | Admitting: Internal Medicine

## 2016-09-19 NOTE — Telephone Encounter (Signed)
Referral was placed yesterday by Magda Paganini. Spoke with pt. Advised her that we are still working on this referral. She verbalized understanding. Nothing further was needed.

## 2016-09-21 ENCOUNTER — Encounter: Payer: Medicare Other | Admitting: Thoracic Surgery (Cardiothoracic Vascular Surgery)

## 2016-10-03 ENCOUNTER — Encounter: Payer: Self-pay | Admitting: Thoracic Surgery (Cardiothoracic Vascular Surgery)

## 2016-10-03 ENCOUNTER — Institutional Professional Consult (permissible substitution) (INDEPENDENT_AMBULATORY_CARE_PROVIDER_SITE_OTHER): Payer: Medicare Other | Admitting: Thoracic Surgery (Cardiothoracic Vascular Surgery)

## 2016-10-03 ENCOUNTER — Other Ambulatory Visit: Payer: Self-pay | Admitting: *Deleted

## 2016-10-03 VITALS — BP 190/72 | HR 84 | Resp 18 | Ht 60.0 in | Wt 102.0 lb

## 2016-10-03 DIAGNOSIS — R911 Solitary pulmonary nodule: Secondary | ICD-10-CM

## 2016-10-03 DIAGNOSIS — R918 Other nonspecific abnormal finding of lung field: Secondary | ICD-10-CM | POA: Diagnosis not present

## 2016-10-03 DIAGNOSIS — R0602 Shortness of breath: Secondary | ICD-10-CM

## 2016-10-03 NOTE — Progress Notes (Signed)
PCP is Penni Bombard, PA Referring Provider is Tanda Rockers, MD  Chief Complaint  Patient presents with  . Pulmonary Infiltrate    Chest CT 08/29/16    HPI: 83 year old woman sent for consultation regarding a right upper lobe mass.  [name] is an 83 year old woman who is a lifelong nonsmoker. Past medical history significant for anxiety, hypertension, COPD, GERD, esophageal stricture, osteoporosis, and arthritis. She has had a cough for about 2 years now. This cough is nonproductive and intermittent. When it first developed she was treated for possible community-acquired pneumonia. Chest x-ray was done but I do not have the films for review. She recently had a CT of the chest and was found to have a right upper lobe abnormality. She self-referred to Dr. Melvyn Novas. He did a PET/CT which showed the right upper lobe opacity was hypermetabolic with an SUV of 5.8. There was no hypermetabolic hilar or mediastinal adenopathy. There was some activity in the neck associated with a small cervical node.  She still lives at home by herself. She is a lifelong nonsmoker but does have significant secondhand exposure. Her appetite is good and her weight is unchanged. She is physically active. She does get short of breath when walking up and down her driveway which she says is very steep. She has chronic arthritis and joint swelling in her hands. That has not changed recently.  Zubrod Score: At the time of surgery this patient's most appropriate activity status/level should be described as: []     0    Normal activity, no symptoms [x]     1    Restricted in physical strenuous activity but ambulatory, able to do out light work []     2    Ambulatory and capable of self care, unable to do work activities, up and about >50 % of waking hours                              []     3    Only limited self care, in bed greater than 50% of waking hours []     4    Completely disabled, no self care, confined to bed or  chair []     5    Moribund   Past Medical History:  Diagnosis Date  . Anxiety disorder   . Arthritis   . COPD (chronic obstructive pulmonary disease) ([place])   . 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 Esophageal stricture   . GERD (gastroesophageal reflux disease)   . History of gastritis   . History of phlebitis   . Hypertension   . Osteoporosis     Past Surgical History:  Procedure Laterality Date  . BREAST CYST EXCISION     left  . CATARACT EXTRACTION     bilateral  . KNEE ARTHROSCOPY     right    Family History  Problem Relation Age of Onset  . Breast cancer Mother   . Breast cancer Sister        x 2  . Uterine cancer Unknown   . Colon cancer Neg Hx     Social History Social History  Substance Use Topics  . Smoking status: Never Smoker  . Smokeless tobacco: Never Used  . Alcohol use Yes     Comment: occasional    Current Outpatient Prescriptions  Medication Sig Dispense Refill  . amLODipine (NORVASC) 10 MG tablet Take 10 mg by  mouth daily.    Marland Kitchen aspirin EC 81 MG tablet Take 81 mg by mouth daily.    Marland Kitchen atenolol (TENORMIN) 25 MG tablet Take by mouth daily.    . Cranberry-Vitamin C-Vitamin E (CRANBERRY PLUS VITAMIN C PO) Take 1 capsule by mouth daily.    Marland Kitchen denosumab (PROLIA) 60 MG/ML SOLN injection Inject 60 mg into the skin every 6 (six) months. Administer in upper arm, thigh, or abdomen    . losartan (COZAAR) 50 MG tablet Take 50 mg by mouth daily.     Marland Kitchen omeprazole (PRILOSEC) 40 MG capsule Take 40 mg by mouth daily.    . pravastatin (PRAVACHOL) 20 MG tablet     . Pyridoxine HCl (B-6 PO) Take 1 capsule by mouth daily.    . sertraline (ZOLOFT) 50 MG tablet Take 50 mg by mouth 2 (two) times daily.     . vitamin E 400 UNIT capsule Take 400 Units by mouth daily.       No current facility-administered medications for this visit.     Allergies  Allergen Reactions  . Propoxyphene N-Acetaminophen   . Sulfonamide Derivatives   . Sulfur     Review  of Systems  Constitutional: Negative for activity change, appetite change, chills, diaphoresis, fatigue, fever and unexpected weight change.  HENT: Negative for trouble swallowing and voice change.   Eyes: Negative for visual disturbance.  Respiratory: Positive for cough and shortness of breath. Negative for wheezing.   Cardiovascular: Negative for chest pain, palpitations and leg swelling.  Gastrointestinal: Negative for abdominal pain and blood in stool.  Genitourinary: Negative for difficulty urinating and dysuria.       History of UTIs  Musculoskeletal: Positive for arthralgias and joint swelling.  Neurological: Negative for syncope and weakness.  Hematological: Negative for adenopathy. Bruises/bleeds easily.  Psychiatric/Behavioral: Negative for dysphoric mood. The patient is nervous/anxious.   All other systems reviewed and are negative.   BP (!) 190/72   Pulse 84   Resp 18   Ht 5' (1.524 m)   Wt 102 lb (46.3 kg)   SpO2 95% Comment: RA  BMI 19.92 kg/m  Physical Exam  Constitutional: She is oriented to person, place, and time. She appears well-developed and well-nourished. No distress.  HENT:  Head: Normocephalic and atraumatic.  Mouth/Throat: No oropharyngeal exudate.  Eyes: Conjunctivae and EOM are normal. No scleral icterus.  Neck: Neck supple. No thyromegaly present.  Cardiovascular: Normal rate, regular rhythm, normal heart sounds and intact distal pulses.  Exam reveals no gallop and no friction rub.   No murmur heard. Pulmonary/Chest: Effort normal and breath sounds normal. No respiratory distress. She has no wheezes. She has no rales.  Abdominal: Soft. She exhibits no distension. There is no tenderness.  Musculoskeletal: She exhibits deformity (Fingers). She exhibits no edema.  Lymphadenopathy:    She has no cervical adenopathy.  Neurological: She is alert and oriented to person, place, and time. No cranial nerve deficit.  Motor grossly intact, stable gait  Skin:  Skin is warm and dry.  Vitals reviewed.    Diagnostic Tests: NUCLEAR MEDICINE PET SKULL BASE TO THIGH  TECHNIQUE: 5.0 mCi F-18 FDG was injected intravenously. Full-ring PET imaging was performed from the skull base to thigh after the radiotracer. CT data was obtained and used for attenuation correction and anatomic localization.  FASTING BLOOD GLUCOSE:  Value: 102 mg/dl  COMPARISON:  Neck CT dated 08/25/2016  FINDINGS: NECK  Just below the inferior margin of the right parotid gland there  is a presumed lateral cervical lymph node with maximum SUV of 5.1, but for correlation on the CT data possibly due to streak artifact from the patient's dental work. There is previously a 5 mm lateral cervical lymph node in this vicinity on prior CT neck from 08/25/2016.  CHEST  The right upper lobe nodule measures 2.7 by 1.7 cm on image 39/4, with maximum standard uptake value of 5.8. There is some surrounding ground-glass density as well as an adjacent 0.6 by 0.4 cm nodule on image 17/8 without appreciable associated independent activity.  Coronary, aortic arch, and branch vessel atherosclerotic vascular disease. No hypermetabolic adenopathy in the chest.  ABDOMEN/PELVIS  No abnormal hypermetabolic activity within the liver, pancreas, adrenal glands, or spleen. No hypermetabolic lymph nodes in the abdomen or pelvis.  Aortoiliac atherosclerotic vascular disease. Mild right renal atrophy compared to the left scattered sigmoid colon diverticula. No adrenal mass.  Calcification in the right kidney upper pole parenchyma on image 87/4, nonspecific but probably benign/incidental.  SKELETON  No focal hypermetabolic activity to suggest skeletal metastasis. There is evidence of lumbar spondylosis and degenerative disc disease along with grade 1 degenerative subluxations.  IMPRESSION: 1. Moderately hypermetabolic right upper lobe lung nodule, 2.7 cm in long axis,  maximum SUV 5.8, favoring malignancy. 2. In the neck just below the right parotid gland, there is a small focus of accentuated metabolic activity, SUV 5.1. There is not a good CT correlate on today's exam due to streak artifact from the patient's dental work, but on the recent CT neck from 08/25/2016 there is a small lateral cervical lymph node in this vicinity on image 138/7 measuring 5 mm in short axis. Although quite likely incidental, this lesion should probably be surveilled. 3.  Aortic Atherosclerosis (ICD10-I70.0).  Coronary atherosclerosis. 4. Lumbar spondylosis and degenerative disc disease. 5. Right renal atrophy.   Electronically Signed   By: Van Clines M.D.   On: 09/15/2016 13:46 I personally reviewed the PET CT images and concur with the findings noted above  Impression: [name] is an 83 year old woman who is a lifelong nonsmoker who presented with a 2 year history of a chronic dry cough. She was found to have a right upper lobe opacity. On PET CT she has a 2.7 cm right upper lobe nodule that is mixed solid and some solid with a small satellite nodule in vicinity. This is hypermetabolic with an SUV of 5.8. This most likely is a new primary bronchogenic carcinoma but granulomatous inflammation is in the differential.  She is in pretty good shape for her age but is 83 years old and I do think that age we should at least try to establish a diagnosis before proceeding directly to surgical resection. I recommended to her that we try a navigational bronchoscopy to biopsy the lesion to guide additional treatment. She will need a new CT of the chest with super dimension protocol for that procedure.  I discussed the general nature of the procedure with [name] and her friend who accompanied her. They understand this is diagnostic and not therapeutic. We would do it in the operating room under general anesthesia. She understands the risk of the procedure include those  associated with general anesthesia. I reviewed the indications, risks, benefits, and alternatives. She understands risks associated with the procedure including failure to make a diagnosis, pneumothorax, and bleeding. Also general risks of associated with invasive procedures and general anesthesia such as MI, stroke, DVT, PE and in rare instances death.  She  does need pulmonary function testing as we assess her fitness to undergo surgical resection.  Plan: Navigational bronchoscopy on Thursday, 10/12/2016  [name], MD [facility] [phone]

## 2016-10-04 ENCOUNTER — Other Ambulatory Visit: Payer: Self-pay | Admitting: *Deleted

## 2016-10-04 DIAGNOSIS — R911 Solitary pulmonary nodule: Secondary | ICD-10-CM

## 2016-10-06 ENCOUNTER — Encounter (HOSPITAL_COMMUNITY): Payer: Self-pay

## 2016-10-06 NOTE — Pre-Procedure Instructions (Signed)
[name]  10/06/2016      [id number] - [address], [place] [address] [place] [address] Phone: [phone] Fax: [phone]    Your procedure is scheduled on Thursday, October 12, 2016  Report to [facility] Admitting Entrance "A" at 6:00 A.M.  Call this number if you have problems the morning of surgery:  [phone]   Remember:  Do not eat food or drink liquids after midnight on October 11, 2016  Follow your doctor's order for your medications, unless otherwise stated also:  Take these medicines the morning of surgery with A SIP OF WATER: AmLODipine (NORVASC), Atenolol (TENORMIN), Omeprazole (PRILOSEC), and Sertraline (ZOLOFT). If needed: Acetaminophen (TYLENOL) for pain and Fluticasone (FLONASE) for allergies.  As of today, stop taking all Aspirins, Vitamins, Fish oils, and Herbal medications. Also stop all NSAIDS i.e. Advil, Motrin, Aleve, Anaprox, Naproxen, BC and Goody Powders.   Do not wear jewelry, make-up or nail polish.  Do not wear lotions, powders, or perfumes, or deodorant.  Do not shave 48 hours prior to surgery.    Do not bring valuables to the hospital.  [facility] is not responsible for any belongings or valuables.  Contacts, dentures or bridgework may not be worn into surgery.  Leave your suitcase in the car.  After surgery it may be brought to your room.  For patients admitted to the hospital, discharge time will be determined by your treatment team.  Patients discharged the day of surgery will not be allowed to drive home.   Special instructions:  [facility]- Preparing For Surgery  Before surgery, you can play an important role. Because skin is not sterile, your skin needs to be as free of germs as possible. You can reduce the number of germs on your skin by washing with CHG (chlorahexidine gluconate) Soap before surgery.  CHG is an antiseptic cleaner which kills germs and bonds with the  skin to continue killing germs even after washing.  Please do not use if you have an allergy to CHG or antibacterial soaps. If your skin becomes reddened/irritated stop using the CHG.  Do not shave (including legs and underarms) for at least 48 hours prior to first CHG shower. It is OK to shave your face.  Please follow these instructions carefully.   1. Shower the NIGHT BEFORE SURGERY and the MORNING OF SURGERY with CHG.   2. If you chose to wash your hair, wash your hair first as usual with your normal shampoo.  3. After you shampoo, rinse your hair and body thoroughly to remove the shampoo.  4. Use CHG as you would any other liquid soap. You can apply CHG directly to the skin and wash gently with a scrungie or a clean washcloth.   5. Apply the CHG Soap to your body ONLY FROM THE NECK DOWN.  Do not use on open wounds or open sores. Avoid contact with your eyes, ears, mouth and genitals (private parts). Wash genitals (private parts) with your normal soap.  6. Wash thoroughly, paying special attention to the area where your surgery will be performed.  7. Thoroughly rinse your body with warm water from the neck down.  8. DO NOT shower/wash with your normal soap after using and rinsing off the CHG Soap.  9. Pat yourself dry with a CLEAN TOWEL.   10. Wear CLEAN PAJAMAS   11. Place CLEAN SHEETS on your bed the night of your first shower and DO  NOT SLEEP WITH PETS.  Day of Surgery: Do not apply any deodorants/lotions. Please wear clean clothes to the hospital/surgery center.    Please read over the following fact sheets that you were given. Pain Booklet, Coughing and Deep Breathing and Surgical Site Infection Prevention

## 2016-10-09 ENCOUNTER — Encounter (HOSPITAL_COMMUNITY)
Admission: RE | Admit: 2016-10-09 | Discharge: 2016-10-09 | Disposition: A | Discharge status: Home / Self Care | Payer: Medicare Other | Source: Ambulatory Visit | Attending: Thoracic Surgery (Cardiothoracic Vascular Surgery) | Admitting: Thoracic Surgery (Cardiothoracic Vascular Surgery)

## 2016-10-09 ENCOUNTER — Encounter (HOSPITAL_COMMUNITY): Payer: Self-pay

## 2016-10-09 ENCOUNTER — Ambulatory Visit (HOSPITAL_COMMUNITY)
Admission: RE | Admit: 2016-10-09 | Discharge: 2016-10-09 | Disposition: A | Discharge status: Home / Self Care | Payer: Medicare Other | Source: Ambulatory Visit | Attending: Thoracic Surgery (Cardiothoracic Vascular Surgery) | Admitting: Thoracic Surgery (Cardiothoracic Vascular Surgery)

## 2016-10-09 DIAGNOSIS — Z01812 Encounter for preprocedural laboratory examination: Secondary | ICD-10-CM | POA: Diagnosis not present

## 2016-10-09 DIAGNOSIS — I700 Atherosclerosis of aorta: Secondary | ICD-10-CM | POA: Insufficient documentation

## 2016-10-09 DIAGNOSIS — Z01818 Encounter for other preprocedural examination: Secondary | ICD-10-CM | POA: Diagnosis not present

## 2016-10-09 DIAGNOSIS — R911 Solitary pulmonary nodule: Secondary | ICD-10-CM

## 2016-10-09 DIAGNOSIS — Z01810 Encounter for preprocedural cardiovascular examination: Secondary | ICD-10-CM | POA: Diagnosis not present

## 2016-10-09 HISTORY — DX: Occlusion and stenosis of unspecified carotid artery: I65.29

## 2016-10-09 HISTORY — DX: Urinary tract infection, site not specified: N39.0

## 2016-10-09 HISTORY — DX: Anxiety disorder, unspecified: F41.9

## 2016-10-09 HISTORY — DX: Solitary pulmonary nodule: R91.1

## 2016-10-09 HISTORY — DX: Adverse effect of unspecified anesthetic, initial encounter: T41.45XA

## 2016-10-09 HISTORY — DX: Other specified postprocedural states: Z98.890

## 2016-10-09 HISTORY — DX: Other specified postprocedural states: R11.2

## 2016-10-09 HISTORY — DX: Pneumonia, unspecified organism: J18.9

## 2016-10-09 HISTORY — DX: Other complications of anesthesia, initial encounter: T88.59XA

## 2016-10-09 LAB — APTT: APTT: 29 s (ref 24–36)

## 2016-10-09 LAB — COMPREHENSIVE METABOLIC PANEL
ALBUMIN: 4.3 g/dL (ref 3.5–5.0)
ALT: 17 U/L (ref 14–54)
ANION GAP: 10 (ref 5–15)
AST: 23 U/L (ref 15–41)
Alkaline Phosphatase: 70 U/L (ref 38–126)
BILIRUBIN TOTAL: 0.6 mg/dL (ref 0.3–1.2)
BUN: 18 mg/dL (ref 6–20)
CHLORIDE: 104 mmol/L (ref 101–111)
CO2: 26 mmol/L (ref 22–32)
Calcium: 9 mg/dL (ref 8.9–10.3)
Creatinine, Ser: 1.33 mg/dL — ABNORMAL HIGH (ref 0.44–1.00)
GFR, EST AFRICAN AMERICAN: 41 mL/min — AB (ref >60)
GFR, EST NON AFRICAN AMERICAN: 35 mL/min — AB (ref >60)
Glucose, Bld: 96 mg/dL (ref 65–99)
Potassium: 3.5 mmol/L (ref 3.5–5.1)
Sodium: 140 mmol/L (ref 135–145)
Total Protein: 7.2 g/dL (ref 6.5–8.1)

## 2016-10-09 LAB — CBC
HEMATOCRIT: 37.7 % (ref 36.0–46.0)
HEMOGLOBIN: 12.6 g/dL (ref 12.0–15.0)
MCH: 29.7 pg (ref 26.0–34.0)
MCHC: 33.4 g/dL (ref 30.0–36.0)
MCV: 88.9 fL (ref 78.0–100.0)
Platelets: 194 10*3/uL (ref 150–400)
RBC: 4.24 MIL/uL (ref 3.87–5.11)
RDW: 13.3 % (ref 11.5–15.5)
WBC: 7.1 10*3/uL (ref 4.0–10.5)

## 2016-10-09 LAB — PROTIME-INR
INR: 0.99
PROTHROMBIN TIME: 13 s (ref 11.4–15.2)

## 2016-10-09 NOTE — Progress Notes (Signed)
PCP -  [facility]  Cardiologist - Denies  Chest x-ray - 10/09/16  EKG - 10/09/16  Stress Test - Denies  ECHO - Denies  Cardiac Cath - Denies  Sleep Study - No CPAP - None  Chart will be given to anesthesia for review due to EKG.   Pt denies having chest pain, sob, or fever at this time. All instructions explained to the pt, with a verbal understanding of the material. Pt agrees to go over the instructions while at home for a better understanding. The opportunity to ask questions was provided.

## 2016-10-10 ENCOUNTER — Ambulatory Visit (HOSPITAL_COMMUNITY): Payer: Medicare Other | Admitting: Emergency Medicine

## 2016-10-10 ENCOUNTER — Ambulatory Visit (HOSPITAL_COMMUNITY): Payer: Medicare Other | Admitting: Anesthesiology

## 2016-10-10 ENCOUNTER — Ambulatory Visit
Admission: RE | Admit: 2016-10-10 | Discharge: 2016-10-10 | Disposition: A | Discharge status: Home / Self Care | Payer: Medicare Other | Source: Ambulatory Visit | Attending: Thoracic Surgery (Cardiothoracic Vascular Surgery) | Admitting: Thoracic Surgery (Cardiothoracic Vascular Surgery)

## 2016-10-10 ENCOUNTER — Ambulatory Visit (HOSPITAL_COMMUNITY)
Admission: RE | Admit: 2016-10-10 | Discharge: 2016-10-10 | Disposition: A | Discharge status: Home / Self Care | Payer: Medicare Other | Source: Ambulatory Visit | Attending: Thoracic Surgery (Cardiothoracic Vascular Surgery) | Admitting: Thoracic Surgery (Cardiothoracic Vascular Surgery)

## 2016-10-10 DIAGNOSIS — R911 Solitary pulmonary nodule: Secondary | ICD-10-CM | POA: Diagnosis not present

## 2016-10-10 DIAGNOSIS — R0602 Shortness of breath: Secondary | ICD-10-CM

## 2016-10-10 DIAGNOSIS — J449 Chronic obstructive pulmonary disease, unspecified: Secondary | ICD-10-CM | POA: Insufficient documentation

## 2016-10-10 LAB — PULMONARY FUNCTION TEST
DL/VA % PRED: 70 %
DL/VA: 3 ml/min/mmHg/L
DLCO COR: 12.16 ml/min/mmHg
DLCO cor % pred: 64 %
DLCO unc % pred: 62 %
DLCO unc: 11.85 ml/min/mmHg
FEF 25-75 Post: 1.29 L/sec
FEF 25-75 Pre: 1.59 L/sec
FEF2575-%CHANGE-POST: -19 %
FEF2575-%PRED-PRE: 173 %
FEF2575-%Pred-Post: 139 %
FEV1-%Change-Post: -3 %
FEV1-%PRED-PRE: 135 %
FEV1-%Pred-Post: 129 %
FEV1-Post: 1.81 L
FEV1-Pre: 1.88 L
FEV1FVC-%CHANGE-POST: 5 %
FEV1FVC-%Pred-Pre: 105 %
FEV6-%CHANGE-POST: -8 %
FEV6-%PRED-PRE: 138 %
FEV6-%Pred-Post: 125 %
FEV6-PRE: 2.45 L
FEV6-Post: 2.23 L
FEV6FVC-%PRED-PRE: 107 %
FEV6FVC-%Pred-Post: 107 %
FVC-%CHANGE-POST: -8 %
FVC-%PRED-POST: 116 %
FVC-%PRED-PRE: 128 %
FVC-PRE: 2.45 L
FVC-Post: 2.23 L
POST FEV1/FVC RATIO: 81 %
PRE FEV6/FVC RATIO: 100 %
Post FEV6/FVC ratio: 100 %
Pre FEV1/FVC ratio: 77 %
RV % pred: 133 %
RV: 3.05 L
TLC % pred: 123 %
TLC: 5.49 L

## 2016-10-10 MED ORDER — ALBUTEROL SULFATE (2.5 MG/3ML) 0.083% IN NEBU
2.5000 mg | INHALATION_SOLUTION | Freq: Once | RESPIRATORY_TRACT | Status: AC
  Administered 2016-10-10: 2.5 mg via RESPIRATORY_TRACT

## 2016-10-10 NOTE — Progress Notes (Signed)
Anesthesia Chart Review:  Pt is an 83 year old female scheduled for video bronchoscopy with endobronchial navigation on 10/12/2016 with Modesto Charon, MD  - PCP is Hubbard Robinson, PA  PMH includes:  HTN, carotid stenosis, COPD, esophageal stricture, post-op N/V, GERD. Never smoker. BMI 20  Medications include: Amlodipine, ASA 81 mg, atenolol, denosumab, losartan, Prilosec, pravastatin  BP (!) 171/72   Pulse 69   Temp 36.9 C (Oral)   Resp 18   Ht 5' (1.524 m)   Wt 102 lb 3 oz (46.4 kg)   SpO2 100%   BMI 19.96 kg/m    Preoperative labs reviewed.    CXR 10/09/16: No acute disease in patient with a right upper lobe pulmonary nodule. Atherosclerosis.  EKG 10/09/16: NSR. Possible LA enlargement. LVH. ST & T wave abnormality, consider anterior ischemia. EKG with artifact; will repeat DOS.   CT angio neck 08/25/16 (care everywhere):  1. 70% stenosis at the left carotid bifurcation. 2. Atherosclerotic changes of the right carotid bifurcation without significant stenosis relative to the more distal vessel. 3. Tortuosity of the common carotid arteries and internal carotid artery's without other significant stenoses. 4. Multilevel degenerative changes in the cervical spine as described. 5. 2.7 cm spiculated nodule in the right upper lobe is highly concerning for primary bronchogenic carcinoma. Recommend CT the chest with contrast for further evaluation. 6. Additional 5 mm nodule posteriorly in the right upper lobe is also noted.  If EKG acceptable DOS, I anticipate pt can proceed with surgery as scheduled.   Willeen Cass, FNP-BC [facility]Anesthesiology Phone: [phone] 10/10/2016 10:55 AM

## 2016-10-11 ENCOUNTER — Encounter (HOSPITAL_COMMUNITY): Payer: Self-pay | Admitting: Anesthesiology

## 2016-10-11 NOTE — Anesthesia Preprocedure Evaluation (Addendum)
Anesthesia Evaluation    Reviewed: Allergy & Precautions, Patient's Chart, lab work & pertinent test results  History of Anesthesia Complications (+) PONV and history of anesthetic complications  Airway        Dental   Pulmonary COPD,           Cardiovascular hypertension, Pt. on medications and Pt. on home beta blockers + Peripheral Vascular Disease       Neuro/Psych PSYCHIATRIC DISORDERS Anxiety negative neurological ROS     GI/Hepatic Neg liver ROS, GERD  Medicated,  Endo/Other  negative endocrine ROS  Renal/GU negative Renal ROS     Musculoskeletal negative musculoskeletal ROS (+) Arthritis , Osteoarthritis,    Abdominal   Peds  Hematology negative hematology ROS (+)   Anesthesia Other Findings Day of surgery medications reviewed with the patient.  Reproductive/Obstetrics                             Anesthesia Physical Anesthesia Plan  ASA: III  Anesthesia Plan: General   Post-op Pain Management:    Induction: Intravenous  PONV Risk Score and Plan: 4 or greater and Ondansetron, Dexamethasone, Midazolam and Treatment may vary due to age or medical condition  Airway Management Planned: Oral ETT  Additional Equipment:   Intra-op Plan:   Post-operative Plan: Extubation in OR  Informed Consent: I have reviewed the patients History and Physical, chart, labs and discussed the procedure including the risks, benefits and alternatives for the proposed anesthesia with the patient or authorized representative who has indicated his/her understanding and acceptance.   Dental advisory given  Plan Discussed with: CRNA  Anesthesia Plan Comments: (Cancelled due to elevated BP. Transferred to ED. )       Anesthesia Quick Evaluation

## 2016-10-12 ENCOUNTER — Encounter (HOSPITAL_COMMUNITY)
Admission: RE | Disposition: A | Discharge status: Home / Self Care | Payer: Self-pay | Source: Ambulatory Visit | Attending: Thoracic Surgery (Cardiothoracic Vascular Surgery)

## 2016-10-12 ENCOUNTER — Ambulatory Visit (HOSPITAL_COMMUNITY)
Admission: RE | Admit: 2016-10-12 | Discharge: 2016-10-12 | Disposition: A | Discharge status: Home / Self Care | Payer: Medicare Other | Source: Ambulatory Visit | Attending: Thoracic Surgery (Cardiothoracic Vascular Surgery) | Admitting: Thoracic Surgery (Cardiothoracic Vascular Surgery)

## 2016-10-12 ENCOUNTER — Encounter (HOSPITAL_COMMUNITY): Payer: Self-pay

## 2016-10-12 ENCOUNTER — Emergency Department (HOSPITAL_COMMUNITY)
Admission: EM | Admit: 2016-10-12 | Discharge: 2016-10-12 | Disposition: A | Discharge status: Home / Self Care | Payer: Medicare Other | Attending: Emergency Medicine | Admitting: Emergency Medicine

## 2016-10-12 DIAGNOSIS — F419 Anxiety disorder, unspecified: Secondary | ICD-10-CM

## 2016-10-12 DIAGNOSIS — M19042 Primary osteoarthritis, left hand: Secondary | ICD-10-CM | POA: Insufficient documentation

## 2016-10-12 DIAGNOSIS — Z853 Personal history of malignant neoplasm of breast: Secondary | ICD-10-CM | POA: Diagnosis not present

## 2016-10-12 DIAGNOSIS — R030 Elevated blood-pressure reading, without diagnosis of hypertension: Secondary | ICD-10-CM

## 2016-10-12 DIAGNOSIS — Z79899 Other long term (current) drug therapy: Secondary | ICD-10-CM | POA: Diagnosis not present

## 2016-10-12 DIAGNOSIS — Z7982 Long term (current) use of aspirin: Secondary | ICD-10-CM | POA: Insufficient documentation

## 2016-10-12 DIAGNOSIS — Z882 Allergy status to sulfonamides status: Secondary | ICD-10-CM | POA: Insufficient documentation

## 2016-10-12 DIAGNOSIS — M19041 Primary osteoarthritis, right hand: Secondary | ICD-10-CM

## 2016-10-12 DIAGNOSIS — F418 Other specified anxiety disorders: Secondary | ICD-10-CM | POA: Insufficient documentation

## 2016-10-12 DIAGNOSIS — J449 Chronic obstructive pulmonary disease, unspecified: Secondary | ICD-10-CM | POA: Insufficient documentation

## 2016-10-12 DIAGNOSIS — R918 Other nonspecific abnormal finding of lung field: Secondary | ICD-10-CM

## 2016-10-12 DIAGNOSIS — K219 Gastro-esophageal reflux disease without esophagitis: Secondary | ICD-10-CM | POA: Insufficient documentation

## 2016-10-12 DIAGNOSIS — I10 Essential (primary) hypertension: Secondary | ICD-10-CM

## 2016-10-12 DIAGNOSIS — R911 Solitary pulmonary nodule: Secondary | ICD-10-CM

## 2016-10-12 DIAGNOSIS — I700 Atherosclerosis of aorta: Secondary | ICD-10-CM

## 2016-10-12 DIAGNOSIS — Z538 Procedure and treatment not carried out for other reasons: Secondary | ICD-10-CM

## 2016-10-12 DIAGNOSIS — I739 Peripheral vascular disease, unspecified: Secondary | ICD-10-CM

## 2016-10-12 SURGERY — VIDEO BRONCHOSCOPY WITH ENDOBRONCHIAL NAVIGATION
Anesthesia: General

## 2016-10-12 MED ORDER — EPHEDRINE 5 MG/ML INJ
INTRAVENOUS | Status: AC
  Filled 2016-10-12: qty 10

## 2016-10-12 MED ORDER — PHENYLEPHRINE 40 MCG/ML (10ML) SYRINGE FOR IV PUSH (FOR BLOOD PRESSURE SUPPORT)
PREFILLED_SYRINGE | INTRAVENOUS | Status: AC
  Filled 2016-10-12: qty 10

## 2016-10-12 MED ORDER — ALPRAZOLAM 0.5 MG PO TABS
0.2500 mg | ORAL_TABLET | Freq: Once | ORAL | Status: AC
  Administered 2016-10-12: 0.25 mg via ORAL
  Filled 2016-10-12: qty 1

## 2016-10-12 MED ORDER — ONDANSETRON HCL 4 MG/2ML IJ SOLN
INTRAMUSCULAR | Status: AC
  Filled 2016-10-12: qty 2

## 2016-10-12 MED ORDER — LOSARTAN POTASSIUM 50 MG PO TABS
50.0000 mg | ORAL_TABLET | Freq: Once | ORAL | Status: AC
  Administered 2016-10-12: 50 mg via ORAL
  Filled 2016-10-12: qty 1

## 2016-10-12 MED ORDER — PROPOFOL 10 MG/ML IV BOLUS
INTRAVENOUS | Status: AC
  Filled 2016-10-12: qty 20

## 2016-10-12 MED ORDER — DEXAMETHASONE SODIUM PHOSPHATE 10 MG/ML IJ SOLN
INTRAMUSCULAR | Status: AC
  Filled 2016-10-12: qty 1

## 2016-10-12 MED ORDER — SUGAMMADEX SODIUM 200 MG/2ML IV SOLN
INTRAVENOUS | Status: AC
  Filled 2016-10-12: qty 2

## 2016-10-12 MED ORDER — FENTANYL CITRATE (PF) 250 MCG/5ML IJ SOLN
INTRAMUSCULAR | Status: AC
  Filled 2016-10-12: qty 5

## 2016-10-12 MED ORDER — LIDOCAINE 2% (20 MG/ML) 5 ML SYRINGE
INTRAMUSCULAR | Status: AC
  Filled 2016-10-12: qty 10

## 2016-10-12 MED ORDER — ROCURONIUM BROMIDE 10 MG/ML (PF) SYRINGE
PREFILLED_SYRINGE | INTRAVENOUS | Status: AC
  Filled 2016-10-12: qty 5

## 2016-10-12 NOTE — H&P (View-Only) (Signed)
PCP is Penni Bombard, PA Referring Provider is Tanda Rockers, MD  Chief Complaint  Patient presents with  . Pulmonary Infiltrate    Chest CT 08/29/16    HPI: 83 year old woman sent for consultation regarding a right upper lobe mass.  Mrs. Karl is an 83 year old woman who is a lifelong nonsmoker. Past medical history significant for anxiety, hypertension, COPD, GERD, esophageal stricture, osteoporosis, and arthritis. She has had a cough for about 2 years now. This cough is nonproductive and intermittent. When it first developed she was treated for possible community-acquired pneumonia. Chest x-ray was done but I do not have the films for review. She recently had a CT of the chest and was found to have a right upper lobe abnormality. She self-referred to Dr. Melvyn Novas. He did a PET/CT which showed the right upper lobe opacity was hypermetabolic with an SUV of 5.8. There was no hypermetabolic hilar or mediastinal adenopathy. There was some activity in the neck associated with a small cervical node.  She still lives at home by herself. She is a lifelong nonsmoker but does have significant secondhand exposure. Her appetite is good and her weight is unchanged. She is physically active. She does get short of breath when walking up and down her driveway which she says is very steep. She has chronic arthritis and joint swelling in her hands. That has not changed recently.  Zubrod Score: At the time of surgery this patient's most appropriate activity status/level should be described as: []     0    Normal activity, no symptoms [x]     1    Restricted in physical strenuous activity but ambulatory, able to do out light work []     2    Ambulatory and capable of self care, unable to do work activities, up and about >50 % of waking hours                              []     3    Only limited self care, in bed greater than 50% of waking hours []     4    Completely disabled, no self care, confined to bed or  chair []     5    Moribund   Past Medical History:  Diagnosis Date  . Anxiety disorder   . Arthritis   . COPD (chronic obstructive pulmonary disease) ([place])   . 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 Esophageal stricture   . GERD (gastroesophageal reflux disease)   . History of gastritis   . History of phlebitis   . Hypertension   . Osteoporosis     Past Surgical History:  Procedure Laterality Date  . BREAST CYST EXCISION     left  . CATARACT EXTRACTION     bilateral  . KNEE ARTHROSCOPY     right    Family History  Problem Relation Age of Onset  . Breast cancer Mother   . Breast cancer Sister        x 2  . Uterine cancer Unknown   . Colon cancer Neg Hx     Social History Social History  Substance Use Topics  . Smoking status: Never Smoker  . Smokeless tobacco: Never Used  . Alcohol use Yes     Comment: occasional    Current Outpatient Prescriptions  Medication Sig Dispense Refill  . amLODipine (NORVASC) 10 MG tablet Take 10 mg by  mouth daily.    Marland Kitchen aspirin EC 81 MG tablet Take 81 mg by mouth daily.    Marland Kitchen atenolol (TENORMIN) 25 MG tablet Take by mouth daily.    . Cranberry-Vitamin C-Vitamin E (CRANBERRY PLUS VITAMIN C PO) Take 1 capsule by mouth daily.    Marland Kitchen denosumab (PROLIA) 60 MG/ML SOLN injection Inject 60 mg into the skin every 6 (six) months. Administer in upper arm, thigh, or abdomen    . losartan (COZAAR) 50 MG tablet Take 50 mg by mouth daily.     Marland Kitchen omeprazole (PRILOSEC) 40 MG capsule Take 40 mg by mouth daily.    . pravastatin (PRAVACHOL) 20 MG tablet     . Pyridoxine HCl (B-6 PO) Take 1 capsule by mouth daily.    . sertraline (ZOLOFT) 50 MG tablet Take 50 mg by mouth 2 (two) times daily.     . vitamin E 400 UNIT capsule Take 400 Units by mouth daily.       No current facility-administered medications for this visit.     Allergies  Allergen Reactions  . Propoxyphene N-Acetaminophen   . Sulfonamide Derivatives   . Sulfur     Review  of Systems  Constitutional: Negative for activity change, appetite change, chills, diaphoresis, fatigue, fever and unexpected weight change.  HENT: Negative for trouble swallowing and voice change.   Eyes: Negative for visual disturbance.  Respiratory: Positive for cough and shortness of breath. Negative for wheezing.   Cardiovascular: Negative for chest pain, palpitations and leg swelling.  Gastrointestinal: Negative for abdominal pain and blood in stool.  Genitourinary: Negative for difficulty urinating and dysuria.       History of UTIs  Musculoskeletal: Positive for arthralgias and joint swelling.  Neurological: Negative for syncope and weakness.  Hematological: Negative for adenopathy. Bruises/bleeds easily.  Psychiatric/Behavioral: Negative for dysphoric mood. The patient is nervous/anxious.   All other systems reviewed and are negative.   BP (!) 190/72   Pulse 84   Resp 18   Ht 5' (1.524 m)   Wt 102 lb (46.3 kg)   SpO2 95% Comment: RA  BMI 19.92 kg/m  Physical Exam  Constitutional: She is oriented to person, place, and time. She appears well-developed and well-nourished. No distress.  HENT:  Head: Normocephalic and atraumatic.  Mouth/Throat: No oropharyngeal exudate.  Eyes: Conjunctivae and EOM are normal. No scleral icterus.  Neck: Neck supple. No thyromegaly present.  Cardiovascular: Normal rate, regular rhythm, normal heart sounds and intact distal pulses.  Exam reveals no gallop and no friction rub.   No murmur heard. Pulmonary/Chest: Effort normal and breath sounds normal. No respiratory distress. She has no wheezes. She has no rales.  Abdominal: Soft. She exhibits no distension. There is no tenderness.  Musculoskeletal: She exhibits deformity (Fingers). She exhibits no edema.  Lymphadenopathy:    She has no cervical adenopathy.  Neurological: She is alert and oriented to person, place, and time. No cranial nerve deficit.  Motor grossly intact, stable gait  Skin:  Skin is warm and dry.  Vitals reviewed.    Diagnostic Tests: NUCLEAR MEDICINE PET SKULL BASE TO THIGH  TECHNIQUE: 5.0 mCi F-18 FDG was injected intravenously. Full-ring PET imaging was performed from the skull base to thigh after the radiotracer. CT data was obtained and used for attenuation correction and anatomic localization.  FASTING BLOOD GLUCOSE:  Value: 102 mg/dl  COMPARISON:  Neck CT dated 08/25/2016  FINDINGS: NECK  Just below the inferior margin of the right parotid gland there  is a presumed lateral cervical lymph node with maximum SUV of 5.1, but for correlation on the CT data possibly due to streak artifact from the patient's dental work. There is previously a 5 mm lateral cervical lymph node in this vicinity on prior CT neck from 08/25/2016.  CHEST  The right upper lobe nodule measures 2.7 by 1.7 cm on image 39/4, with maximum standard uptake value of 5.8. There is some surrounding ground-glass density as well as an adjacent 0.6 by 0.4 cm nodule on image 17/8 without appreciable associated independent activity.  Coronary, aortic arch, and branch vessel atherosclerotic vascular disease. No hypermetabolic adenopathy in the chest.  ABDOMEN/PELVIS  No abnormal hypermetabolic activity within the liver, pancreas, adrenal glands, or spleen. No hypermetabolic lymph nodes in the abdomen or pelvis.  Aortoiliac atherosclerotic vascular disease. Mild right renal atrophy compared to the left scattered sigmoid colon diverticula. No adrenal mass.  Calcification in the right kidney upper pole parenchyma on image 87/4, nonspecific but probably benign/incidental.  SKELETON  No focal hypermetabolic activity to suggest skeletal metastasis. There is evidence of lumbar spondylosis and degenerative disc disease along with grade 1 degenerative subluxations.  IMPRESSION: 1. Moderately hypermetabolic right upper lobe lung nodule, 2.7 cm in long axis,  maximum SUV 5.8, favoring malignancy. 2. In the neck just below the right parotid gland, there is a small focus of accentuated metabolic activity, SUV 5.1. There is not a good CT correlate on today's exam due to streak artifact from the patient's dental work, but on the recent CT neck from 08/25/2016 there is a small lateral cervical lymph node in this vicinity on image 138/7 measuring 5 mm in short axis. Although quite likely incidental, this lesion should probably be surveilled. 3.  Aortic Atherosclerosis (ICD10-I70.0).  Coronary atherosclerosis. 4. Lumbar spondylosis and degenerative disc disease. 5. Right renal atrophy.   Electronically Signed   By: Van Clines M.D.   On: 09/15/2016 13:46 I personally reviewed the PET CT images and concur with the findings noted above  Impression: Mrs. Godbee is an 83 year old woman who is a lifelong nonsmoker who presented with a 2 year history of a chronic dry cough. She was found to have a right upper lobe opacity. On PET CT she has a 2.7 cm right upper lobe nodule that is mixed solid and some solid with a small satellite nodule in vicinity. This is hypermetabolic with an SUV of 5.8. This most likely is a new primary bronchogenic carcinoma but granulomatous inflammation is in the differential.  She is in pretty good shape for her age but is 83 years old and I do think that age we should at least try to establish a diagnosis before proceeding directly to surgical resection. I recommended to her that we try a navigational bronchoscopy to biopsy the lesion to guide additional treatment. She will need a new CT of the chest with super dimension protocol for that procedure.  I discussed the general nature of the procedure with Mrs. Machorro and her friend who accompanied her. They understand this is diagnostic and not therapeutic. We would do it in the operating room under general anesthesia. She understands the risk of the procedure include those  associated with general anesthesia. I reviewed the indications, risks, benefits, and alternatives. She understands risks associated with the procedure including failure to make a diagnosis, pneumothorax, and bleeding. Also general risks of associated with invasive procedures and general anesthesia such as MI, stroke, DVT, PE and in rare instances death.  She  does need pulmonary function testing as we assess her fitness to undergo surgical resection.  Plan: Navigational bronchoscopy on Thursday, 10/12/2016  [name], MD [facility] [phone]

## 2016-10-12 NOTE — ED Triage Notes (Addendum)
Patient was to have OR procedure this am and brought from short stay due to elevated BP. Patient had norvasc prior to leaving home then was administered atenolol at 0630. Procedure canceled and brought to ED. No complaints.Arrived with NSL to left forearm

## 2016-10-12 NOTE — Progress Notes (Signed)
Per Dr. Smith Robert, the BP is too elevated to have her procedure. Pt will be taken to the ER for further evaluation. Pt AAOX4, in no apparent distress, chest pain, or SOB.

## 2016-10-12 NOTE — Interval H&P Note (Signed)
History and Physical Interval Note:   [name] presented for navigational bronchoscopy today. On arrival her BP was 215/80. Currently it is 260/75. She is asymptomatic. She says "this always happens." I am not comfortable proceeding with an elective procedure under these circumstances. In my opinion this is a hypertensive emergency. We will arrange to get her to the ED for appropriate treatment. Will reschedule bronchoscopy for a later time.  Revonda Standard Roxan Hockey, MD [facility] [phone]  10/12/2016 8:08 AM  [name]  has presented today for surgery, with the diagnosis of RUL NODULE   The various methods of treatment have been discussed with the patient and family. After consideration of risks, benefits and other options for treatment, the patient has consented to  Procedure(s): [place] (N/A) as a surgical intervention .  The patient's history has been reviewed, patient examined, no change in status, stable for surgery.  I have reviewed the patient's chart and labs.  Questions were answered to the patient's satisfaction.     [name]

## 2016-10-12 NOTE — ED Provider Notes (Signed)
[facility] DEPT Provider Note   CSN: [id number] Arrival date & time: 10/12/16  3244     History   Chief Complaint No chief complaint on file.   HPI [name] is a 83 y.o. female.  HPI Patient with history of hypertension was scheduled for bronchoscopy as an outpatient today. States she only took her atenolol and Norvasc. Blood pressure was elevated and she was referred to the emergency department. She denies any headache, visual changes, weakness or numbness. No chest pain or shortness of breath. States she is feeling quite anxious. Past Medical History:  Diagnosis Date  . Anxiety   . Anxiety disorder   . Arthritis   . Carotid stenosis   . Complication of anesthesia   . COPD (chronic obstructive pulmonary disease) ([place])   . 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 Esophageal stricture   . GERD (gastroesophageal reflux disease)   . History of gastritis   . History of phlebitis   . Hypertension   . Lung nodule    Right Upper  . Osteoporosis   . Pneumonia   . PONV (postoperative nausea and vomiting)   . UTI (urinary tract infection)     Patient Active Problem List   Diagnosis Date Noted  . Breast cancer in situ 09/05/2016  . Pulmonary infiltrate in right lung on chest x-ray 09/04/2016  . 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 ANXIETY DISORDER 05/14/2008  . Essential hypertension 05/14/2008  . PHLEBITIS 05/14/2008  . COPD 05/14/2008  . ESOPHAGEAL STRICTURE 05/14/2008  . GERD 05/14/2008  . ARTHRITIS 05/14/2008  . OSTEOPOROSIS 05/14/2008  . GASTRITIS, HX OF 05/14/2008    Past Surgical History:  Procedure Laterality Date  . BREAST CYST EXCISION     left  . CATARACT EXTRACTION     bilateral  . CYSTECTOMY     Left knee  . EYE SURGERY    . KNEE ARTHROSCOPY     right/ Pt can not recall    OB History    No data available       Home Medications    Prior to Admission medications   Medication Sig Start Date End Date  Taking? Authorizing Provider  acetaminophen (TYLENOL) 500 MG tablet Take 500 mg by mouth every 8 (eight) hours as needed for mild pain or headache.   Yes [provider]  amLODipine (NORVASC) 10 MG tablet Take 10 mg by mouth daily.   Yes [provider]  aspirin EC 81 MG tablet Take 81 mg by mouth daily.   Yes [provider]  atenolol (TENORMIN) 25 MG tablet Take 25 mg by mouth daily.    Yes [provider]  CRANBERRY PO Take 4,200 mg by mouth daily.    Yes [provider]  denosumab (PROLIA) 60 MG/ML SOLN injection Inject 60 mg into the skin every 6 (six) months. Administer in upper arm, thigh, or abdomen  Next dose is 04-2017   Yes [provider]  losartan (COZAAR) 50 MG tablet Take 50 mg by mouth daily.    Yes [provider]  omeprazole (PRILOSEC) 40 MG capsule Take 40 mg by mouth daily before breakfast.    Yes [provider]  pravastatin (PRAVACHOL) 20 MG tablet Take 20 mg by mouth at bedtime.  07/02/15  Yes [provider]  pyridOXINE (VITAMIN B-6) 100 MG tablet Take 100 mg by mouth daily.   Yes [provider]  sertraline (ZOLOFT) 50 MG  tablet Take 25 mg by mouth 2 (two) times daily.    Yes [provider]  vitamin E 400 UNIT capsule Take 400 Units by mouth daily.     Yes [provider]  fluticasone (FLONASE) 50 MCG/ACT nasal spray Place 1 spray into both nostrils daily as needed for allergies or rhinitis.    [provider]    Family History Family History  Problem Relation Age of Onset  . Breast cancer Mother   . Breast cancer Sister        x 2  . Uterine cancer Unknown   . Colon cancer Neg Hx     Social History Social History  Substance Use Topics  . Smoking status: Never Smoker  . Smokeless tobacco: Never Used  . Alcohol use Yes     Comment: occasional     Allergies   Propoxyphene n-acetaminophen; Sulfonamide derivatives; and Sulfur   Review of  Systems Review of Systems  Constitutional: Negative for chills and fever.  Eyes: Negative for visual disturbance.  Respiratory: Negative for cough and shortness of breath.   Cardiovascular: Negative for chest pain.  Gastrointestinal: Negative for abdominal pain and nausea.  Genitourinary: Negative for dysuria.  Musculoskeletal: Negative for back pain, myalgias and neck pain.  Skin: Negative for rash.  Neurological: Negative for dizziness, weakness, light-headedness, numbness and headaches.  Psychiatric/Behavioral: The patient is nervous/anxious.   All other systems reviewed and are negative.    Physical Exam Updated Vital Signs BP (!) 153/67   Pulse (!) 59   Temp 98.3 F (36.8 C) (Oral)   Resp 18   SpO2 95%   Physical Exam  Constitutional: She is oriented to person, place, and time. She appears well-developed and well-nourished. No distress.  HENT:  Head: Normocephalic and atraumatic.  Mouth/Throat: Oropharynx is clear and moist. No oropharyngeal exudate.  Eyes: Pupils are equal, round, and reactive to light. EOM are normal.  Neck: Normal range of motion. Neck supple.  Cardiovascular: Normal rate and regular rhythm.  Exam reveals no friction rub.   No murmur heard. Pulmonary/Chest: Effort normal and breath sounds normal. No respiratory distress. She has no wheezes. She has no rales. She exhibits no tenderness.  Abdominal: Soft. Bowel sounds are normal. There is no tenderness. There is no rebound and no guarding.  Musculoskeletal: Normal range of motion. She exhibits no edema or tenderness.  No lower extremity swelling, asymmetry or tenderness. Distal pulses 3+  Neurological: She is alert and oriented to person, place, and time.  Moving all extremities without focal deficit. Sensation fully intact.  Skin: Skin is warm and dry. Capillary refill takes less than 2 seconds. No rash noted. No erythema.  Psychiatric: Her behavior is normal.  Mildly anxious appearing  Nursing note  and vitals reviewed.    ED Treatments / Results  Labs (all labs ordered are listed, but only abnormal results are displayed) Labs Reviewed - No data to display  EKG  EKG Interpretation None       Radiology No results found.  Procedures Procedures (including critical care time)  Medications Ordered in ED Medications  losartan (COZAAR) tablet 50 mg (50 mg Oral Given 10/12/16 1056)  ALPRAZolam (XANAX) tablet 0.25 mg (0.25 mg Oral Given 10/12/16 1056)     Initial Impression / Assessment and Plan / ED Course  I have reviewed the triage vital signs and the nursing notes.  Pertinent labs & imaging results that were available during my care of the patient were reviewed by  me and considered in my medical decision making (see chart for details).     Blood pressure has improved with home meds and small dose of Xanax. Advised to follow-up closely with her primary physician. Return precautions have been given.  Final Clinical Impressions(s) / ED Diagnoses   Final diagnoses:  Elevated blood pressure, situational  Anxiety, mild    New Prescriptions New Prescriptions   No medications on file     Julianne Rice, MD 10/12/16 1249

## 2016-10-20 ENCOUNTER — Other Ambulatory Visit: Payer: Self-pay | Admitting: *Deleted

## 2016-10-20 ENCOUNTER — Telehealth: Payer: Self-pay | Admitting: *Deleted

## 2016-10-20 DIAGNOSIS — R911 Solitary pulmonary nodule: Secondary | ICD-10-CM

## 2016-10-20 NOTE — Telephone Encounter (Signed)
Patient called to give an update on PCP visit from today regarding her blood pressure.  It was 171/72.  It is still elevated but much better than when she was here last. Her PCP cleared her for ENB procedure.  Her PCP was to call in some Xanax for the patient to take prior to her procedure since most of this is anxiety.  Dr. Roxan Hockey made aware.  ENB has been re-scheduled for Wednesday 9/26 afternoon.  Patient has been updated with the plan and has no further questions.

## 2016-10-24 NOTE — Progress Notes (Signed)
Left voice message on pt phone with pre-op instructions. Pt made aware to stop taking vitamins, fish oil and herbal medications. Do not take any NSAIDs ie: Ibuprofen, Advil, Naproxen (Aleve), Motrin, BC and Goody Powder or any medication containing Aspirin.

## 2016-10-25 ENCOUNTER — Ambulatory Visit (HOSPITAL_COMMUNITY): Payer: Medicare Other | Admitting: Anesthesiology

## 2016-10-25 ENCOUNTER — Ambulatory Visit (HOSPITAL_COMMUNITY): Payer: Medicare Other

## 2016-10-25 ENCOUNTER — Encounter (HOSPITAL_COMMUNITY)
Admission: RE | Disposition: A | Discharge status: Home / Self Care | Payer: Self-pay | Source: Ambulatory Visit | Attending: Thoracic Surgery (Cardiothoracic Vascular Surgery)

## 2016-10-25 ENCOUNTER — Ambulatory Visit (HOSPITAL_COMMUNITY)
Admission: RE | Admit: 2016-10-25 | Discharge: 2016-10-25 | Disposition: A | Discharge status: Home / Self Care | Payer: Medicare Other | Source: Ambulatory Visit | Attending: Thoracic Surgery (Cardiothoracic Vascular Surgery) | Admitting: Thoracic Surgery (Cardiothoracic Vascular Surgery)

## 2016-10-25 ENCOUNTER — Encounter (HOSPITAL_COMMUNITY): Payer: Self-pay | Admitting: *Deleted

## 2016-10-25 DIAGNOSIS — F419 Anxiety disorder, unspecified: Secondary | ICD-10-CM | POA: Diagnosis not present

## 2016-10-25 DIAGNOSIS — K219 Gastro-esophageal reflux disease without esophagitis: Secondary | ICD-10-CM | POA: Diagnosis not present

## 2016-10-25 DIAGNOSIS — I700 Atherosclerosis of aorta: Secondary | ICD-10-CM | POA: Insufficient documentation

## 2016-10-25 DIAGNOSIS — Z886 Allergy status to analgesic agent status: Secondary | ICD-10-CM | POA: Diagnosis not present

## 2016-10-25 DIAGNOSIS — Z79899 Other long term (current) drug therapy: Secondary | ICD-10-CM | POA: Diagnosis not present

## 2016-10-25 DIAGNOSIS — Z7982 Long term (current) use of aspirin: Secondary | ICD-10-CM | POA: Insufficient documentation

## 2016-10-25 DIAGNOSIS — R911 Solitary pulmonary nodule: Secondary | ICD-10-CM

## 2016-10-25 DIAGNOSIS — M1990 Unspecified osteoarthritis, unspecified site: Secondary | ICD-10-CM | POA: Insufficient documentation

## 2016-10-25 DIAGNOSIS — Z859 Personal history of malignant neoplasm, unspecified: Secondary | ICD-10-CM | POA: Insufficient documentation

## 2016-10-25 DIAGNOSIS — I10 Essential (primary) hypertension: Secondary | ICD-10-CM | POA: Diagnosis not present

## 2016-10-25 DIAGNOSIS — M810 Age-related osteoporosis without current pathological fracture: Secondary | ICD-10-CM | POA: Insufficient documentation

## 2016-10-25 DIAGNOSIS — Z09 Encounter for follow-up examination after completed treatment for conditions other than malignant neoplasm: Secondary | ICD-10-CM

## 2016-10-25 DIAGNOSIS — C3411 Malignant neoplasm of upper lobe, right bronchus or lung: Secondary | ICD-10-CM | POA: Diagnosis not present

## 2016-10-25 DIAGNOSIS — J449 Chronic obstructive pulmonary disease, unspecified: Secondary | ICD-10-CM | POA: Insufficient documentation

## 2016-10-25 DIAGNOSIS — Z882 Allergy status to sulfonamides status: Secondary | ICD-10-CM | POA: Insufficient documentation

## 2016-10-25 HISTORY — PX: VIDEO BRONCHOSCOPY WITH ENDOBRONCHIAL NAVIGATION: SHX6175

## 2016-10-25 LAB — CBC
HCT: 35.5 % — ABNORMAL LOW (ref 36.0–46.0)
Hemoglobin: 12.2 g/dL (ref 12.0–15.0)
MCH: 30 pg (ref 26.0–34.0)
MCHC: 34.4 g/dL (ref 30.0–36.0)
MCV: 87.4 fL (ref 78.0–100.0)
PLATELETS: 183 10*3/uL (ref 150–400)
RBC: 4.06 MIL/uL (ref 3.87–5.11)
RDW: 13.6 % (ref 11.5–15.5)
WBC: 6.1 10*3/uL (ref 4.0–10.5)

## 2016-10-25 LAB — COMPREHENSIVE METABOLIC PANEL
ALT: 16 U/L (ref 14–54)
AST: 20 U/L (ref 15–41)
Albumin: 4.1 g/dL (ref 3.5–5.0)
Alkaline Phosphatase: 65 U/L (ref 38–126)
Anion gap: 4 — ABNORMAL LOW (ref 5–15)
BUN: 19 mg/dL (ref 6–20)
CHLORIDE: 107 mmol/L (ref 101–111)
CO2: 28 mmol/L (ref 22–32)
CREATININE: 1.08 mg/dL — AB (ref 0.44–1.00)
Calcium: 9.1 mg/dL (ref 8.9–10.3)
GFR calc Af Amer: 53 mL/min — ABNORMAL LOW (ref >60)
GFR calc non Af Amer: 45 mL/min — ABNORMAL LOW (ref >60)
Glucose, Bld: 97 mg/dL (ref 65–99)
POTASSIUM: 4 mmol/L (ref 3.5–5.1)
Sodium: 139 mmol/L (ref 135–145)
Total Bilirubin: 0.9 mg/dL (ref 0.3–1.2)
Total Protein: 7 g/dL (ref 6.5–8.1)

## 2016-10-25 LAB — PROTIME-INR
INR: 1.02
Prothrombin Time: 13.3 seconds (ref 11.4–15.2)

## 2016-10-25 LAB — APTT: APTT: 29 s (ref 24–36)

## 2016-10-25 SURGERY — VIDEO BRONCHOSCOPY WITH ENDOBRONCHIAL NAVIGATION
Anesthesia: General | Site: Chest

## 2016-10-25 MED ORDER — OXYCODONE HCL 5 MG/5ML PO SOLN: 5.0000 mg | Freq: Once | ORAL | Status: DC | PRN

## 2016-10-25 MED ORDER — SUGAMMADEX SODIUM 200 MG/2ML IV SOLN
INTRAVENOUS | Status: AC
  Filled 2016-10-25: qty 2

## 2016-10-25 MED ORDER — FENTANYL CITRATE (PF) 250 MCG/5ML IJ SOLN
INTRAMUSCULAR | Status: AC
  Filled 2016-10-25: qty 5

## 2016-10-25 MED ORDER — PROPOFOL 10 MG/ML IV BOLUS
INTRAVENOUS | Status: AC
  Filled 2016-10-25: qty 20

## 2016-10-25 MED ORDER — DEXAMETHASONE SODIUM PHOSPHATE 10 MG/ML IJ SOLN
INTRAMUSCULAR | Status: DC | PRN
  Administered 2016-10-25: 10 mg via INTRAVENOUS

## 2016-10-25 MED ORDER — LIDOCAINE HCL (CARDIAC) 20 MG/ML IV SOLN
INTRAVENOUS | Status: DC | PRN
  Administered 2016-10-25: 40 mg via INTRAVENOUS

## 2016-10-25 MED ORDER — ROCURONIUM BROMIDE 100 MG/10ML IV SOLN
INTRAVENOUS | Status: DC | PRN
  Administered 2016-10-25: 40 mg via INTRAVENOUS

## 2016-10-25 MED ORDER — OXYCODONE HCL 5 MG PO TABS: 5.0000 mg | ORAL_TABLET | Freq: Once | ORAL | Status: DC | PRN

## 2016-10-25 MED ORDER — FENTANYL CITRATE (PF) 100 MCG/2ML IJ SOLN
INTRAMUSCULAR | Status: DC | PRN
  Administered 2016-10-25: 75 ug via INTRAVENOUS
  Administered 2016-10-25: 25 ug via INTRAVENOUS

## 2016-10-25 MED ORDER — PROPOFOL 10 MG/ML IV BOLUS
INTRAVENOUS | Status: DC | PRN
  Administered 2016-10-25: 130 mg via INTRAVENOUS

## 2016-10-25 MED ORDER — LACTATED RINGERS IV SOLN
INTRAVENOUS | Status: DC
  Administered 2016-10-25: 11:00:00 via INTRAVENOUS

## 2016-10-25 MED ORDER — SUCCINYLCHOLINE CHLORIDE 200 MG/10ML IV SOSY
PREFILLED_SYRINGE | INTRAVENOUS | Status: AC
  Filled 2016-10-25: qty 10

## 2016-10-25 MED ORDER — FENTANYL CITRATE (PF) 100 MCG/2ML IJ SOLN: 25.0000 ug | INTRAMUSCULAR | Status: DC | PRN

## 2016-10-25 MED ORDER — ONDANSETRON HCL 4 MG/2ML IJ SOLN
INTRAMUSCULAR | Status: DC | PRN
  Administered 2016-10-25: 4 mg via INTRAVENOUS

## 2016-10-25 MED ORDER — ONDANSETRON HCL 4 MG/2ML IJ SOLN
INTRAMUSCULAR | Status: AC
  Filled 2016-10-25: qty 2

## 2016-10-25 MED ORDER — SUGAMMADEX SODIUM 200 MG/2ML IV SOLN
INTRAVENOUS | Status: DC | PRN
  Administered 2016-10-25: 100 mg via INTRAVENOUS

## 2016-10-25 MED ORDER — PHENYLEPHRINE HCL 10 MG/ML IJ SOLN
INTRAMUSCULAR | Status: DC | PRN
  Administered 2016-10-25: 40 ug/min via INTRAVENOUS

## 2016-10-25 MED ORDER — ONDANSETRON HCL 4 MG/2ML IJ SOLN
4.0000 mg | Freq: Once | INTRAMUSCULAR | Status: DC | PRN
Start: 2016-10-25 — End: 2016-10-26

## 2016-10-25 MED ORDER — 0.9 % SODIUM CHLORIDE (POUR BTL) OPTIME
TOPICAL | Status: DC | PRN
  Administered 2016-10-25: 1000 mL

## 2016-10-25 SURGICAL SUPPLY — 39 items
ADAPTER BRONCH F/PENTAX (ADAPTER) ×3 IMPLANT
BRUSH BIOPSY BRONCH 10 SDTNB (MISCELLANEOUS) ×2 IMPLANT
BRUSH BIOPSY BRONCH 10MM SDTNB (MISCELLANEOUS) ×1
BRUSH SUPERTRAX BIOPSY (INSTRUMENTS) IMPLANT
BRUSH SUPERTRAX NDL-TIP CYTO (INSTRUMENTS) ×6 IMPLANT
CANISTER SUCT 3000ML PPV (MISCELLANEOUS) ×3 IMPLANT
CHANNEL WORK EXTEND EDGE 180 (KITS) IMPLANT
CHANNEL WORK EXTEND EDGE 45 (KITS) IMPLANT
CHANNEL WORK EXTEND EDGE 90 (KITS) IMPLANT
CONT SPEC 4OZ CLIKSEAL STRL BL (MISCELLANEOUS) ×6 IMPLANT
COVER BACK TABLE 60X90IN (DRAPES) ×3 IMPLANT
FILTER STRAW FLUID ASPIR (MISCELLANEOUS) IMPLANT
FORCEPS BIOP SUPERTRX PREMAR (INSTRUMENTS) IMPLANT
GAUZE SPONGE 4X4 12PLY STRL (GAUZE/BANDAGES/DRESSINGS) ×3 IMPLANT
GLOVE SURG SIGNA 7.5 PF LTX (GLOVE) ×3 IMPLANT
GOWN STRL REUS W/ TWL XL LVL3 (GOWN DISPOSABLE) ×1 IMPLANT
GOWN STRL REUS W/TWL XL LVL3 (GOWN DISPOSABLE) ×2
KIT CLEAN ENDO COMPLIANCE (KITS) ×3 IMPLANT
KIT PROCEDURE EDGE 180 (KITS) ×6 IMPLANT
KIT PROCEDURE EDGE 45 (KITS) IMPLANT
KIT PROCEDURE EDGE 90 (KITS) IMPLANT
KIT ROOM TURNOVER OR (KITS) ×3 IMPLANT
MARKER SKIN DUAL TIP RULER LAB (MISCELLANEOUS) ×3 IMPLANT
NEEDLE SUPERTRX PREMARK BIOPSY (NEEDLE) IMPLANT
NS IRRIG 1000ML POUR BTL (IV SOLUTION) ×3 IMPLANT
OIL SILICONE PENTAX (PARTS (SERVICE/REPAIRS)) ×3 IMPLANT
PAD ARMBOARD 7.5X6 YLW CONV (MISCELLANEOUS) ×6 IMPLANT
PATCHES PATIENT (LABEL) ×9 IMPLANT
SYR 20CC LL (SYRINGE) ×3 IMPLANT
SYR 20ML ECCENTRIC (SYRINGE) ×3 IMPLANT
SYR 30ML LL (SYRINGE) ×3 IMPLANT
SYR 5ML LL (SYRINGE) ×3 IMPLANT
SYSTEM GENCUT CORE BIOPSY (NEEDLE) ×3 IMPLANT
TOWEL GREEN STERILE FF (TOWEL DISPOSABLE) ×3 IMPLANT
TRAP SPECIMEN MUCOUS 40CC (MISCELLANEOUS) ×3 IMPLANT
TUBE CONNECTING 20'X1/4 (TUBING) ×2
TUBE CONNECTING 20X1/4 (TUBING) ×4 IMPLANT
UNDERPAD 30X30 (UNDERPADS AND DIAPERS) ×3 IMPLANT
WATER STERILE IRR 1000ML POUR (IV SOLUTION) ×3 IMPLANT

## 2016-10-25 NOTE — Interval H&P Note (Signed)
History and Physical Interval Note:  Biopsy was cancelled previously for severe hypertensive emergency. Today BP still elevated but much better than it was before. Will proceed with ENB 10/25/2016 1:17 PM  ERLINDA SOLINGER  has presented today for surgery, with the diagnosis of RUL NODULE  The various methods of treatment have been discussed with the patient and family. After consideration of risks, benefits and other options for treatment, the patient has consented to  Procedure(s): [place] (N/A) as a surgical intervention .  The patient's history has been reviewed, patient examined, no change in status, stable for surgery.  I have reviewed the patient's chart and labs.  Questions were answered to the patient's satisfaction.     [name]

## 2016-10-25 NOTE — H&P (View-Only) (Signed)
PCP is Penni Bombard, PA Referring Provider is Tanda Rockers, MD  Chief Complaint  Patient presents with  . Pulmonary Infiltrate    Chest CT 08/29/16    HPI: 83 year old woman sent for consultation regarding a right upper lobe mass.  [name] is an 83 year old woman who is a lifelong nonsmoker. Past medical history significant for anxiety, hypertension, COPD, GERD, esophageal stricture, osteoporosis, and arthritis. She has had a cough for about 2 years now. This cough is nonproductive and intermittent. When it first developed she was treated for possible community-acquired pneumonia. Chest x-ray was done but I do not have the films for review. She recently had a CT of the chest and was found to have a right upper lobe abnormality. She self-referred to Dr. Melvyn Novas. He did a PET/CT which showed the right upper lobe opacity was hypermetabolic with an SUV of 5.8. There was no hypermetabolic hilar or mediastinal adenopathy. There was some activity in the neck associated with a small cervical node.  She still lives at home by herself. She is a lifelong nonsmoker but does have significant secondhand exposure. Her appetite is good and her weight is unchanged. She is physically active. She does get short of breath when walking up and down her driveway which she says is very steep. She has chronic arthritis and joint swelling in her hands. That has not changed recently.  Zubrod Score: At the time of surgery this patient's most appropriate activity status/level should be described as: []     0    Normal activity, no symptoms [x]     1    Restricted in physical strenuous activity but ambulatory, able to do out light work []     2    Ambulatory and capable of self care, unable to do work activities, up and about >50 % of waking hours                              []     3    Only limited self care, in bed greater than 50% of waking hours []     4    Completely disabled, no self care, confined to bed or  chair []     5    Moribund   Past Medical History:  Diagnosis Date  . Anxiety disorder   . Arthritis   . COPD (chronic obstructive pulmonary disease) ([place])   . 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 Esophageal stricture   . GERD (gastroesophageal reflux disease)   . History of gastritis   . History of phlebitis   . Hypertension   . Osteoporosis     Past Surgical History:  Procedure Laterality Date  . BREAST CYST EXCISION     left  . CATARACT EXTRACTION     bilateral  . KNEE ARTHROSCOPY     right    Family History  Problem Relation Age of Onset  . Breast cancer Mother   . Breast cancer Sister        x 2  . Uterine cancer Unknown   . Colon cancer Neg Hx     Social History Social History  Substance Use Topics  . Smoking status: Never Smoker  . Smokeless tobacco: Never Used  . Alcohol use Yes     Comment: occasional    Current Outpatient Prescriptions  Medication Sig Dispense Refill  . amLODipine (NORVASC) 10 MG tablet Take 10 mg by  mouth daily.    Marland Kitchen aspirin EC 81 MG tablet Take 81 mg by mouth daily.    Marland Kitchen atenolol (TENORMIN) 25 MG tablet Take by mouth daily.    . Cranberry-Vitamin C-Vitamin E (CRANBERRY PLUS VITAMIN C PO) Take 1 capsule by mouth daily.    Marland Kitchen denosumab (PROLIA) 60 MG/ML SOLN injection Inject 60 mg into the skin every 6 (six) months. Administer in upper arm, thigh, or abdomen    . losartan (COZAAR) 50 MG tablet Take 50 mg by mouth daily.     Marland Kitchen omeprazole (PRILOSEC) 40 MG capsule Take 40 mg by mouth daily.    . pravastatin (PRAVACHOL) 20 MG tablet     . Pyridoxine HCl (B-6 PO) Take 1 capsule by mouth daily.    . sertraline (ZOLOFT) 50 MG tablet Take 50 mg by mouth 2 (two) times daily.     . vitamin E 400 UNIT capsule Take 400 Units by mouth daily.       No current facility-administered medications for this visit.     Allergies  Allergen Reactions  . Propoxyphene N-Acetaminophen   . Sulfonamide Derivatives   . Sulfur     Review  of Systems  Constitutional: Negative for activity change, appetite change, chills, diaphoresis, fatigue, fever and unexpected weight change.  HENT: Negative for trouble swallowing and voice change.   Eyes: Negative for visual disturbance.  Respiratory: Positive for cough and shortness of breath. Negative for wheezing.   Cardiovascular: Negative for chest pain, palpitations and leg swelling.  Gastrointestinal: Negative for abdominal pain and blood in stool.  Genitourinary: Negative for difficulty urinating and dysuria.       History of UTIs  Musculoskeletal: Positive for arthralgias and joint swelling.  Neurological: Negative for syncope and weakness.  Hematological: Negative for adenopathy. Bruises/bleeds easily.  Psychiatric/Behavioral: Negative for dysphoric mood. The patient is nervous/anxious.   All other systems reviewed and are negative.   BP (!) 190/72   Pulse 84   Resp 18   Ht 5' (1.524 m)   Wt 102 lb (46.3 kg)   SpO2 95% Comment: RA  BMI 19.92 kg/m  Physical Exam  Constitutional: She is oriented to person, place, and time. She appears well-developed and well-nourished. No distress.  HENT:  Head: Normocephalic and atraumatic.  Mouth/Throat: No oropharyngeal exudate.  Eyes: Conjunctivae and EOM are normal. No scleral icterus.  Neck: Neck supple. No thyromegaly present.  Cardiovascular: Normal rate, regular rhythm, normal heart sounds and intact distal pulses.  Exam reveals no gallop and no friction rub.   No murmur heard. Pulmonary/Chest: Effort normal and breath sounds normal. No respiratory distress. She has no wheezes. She has no rales.  Abdominal: Soft. She exhibits no distension. There is no tenderness.  Musculoskeletal: She exhibits deformity (Fingers). She exhibits no edema.  Lymphadenopathy:    She has no cervical adenopathy.  Neurological: She is alert and oriented to person, place, and time. No cranial nerve deficit.  Motor grossly intact, stable gait  Skin:  Skin is warm and dry.  Vitals reviewed.    Diagnostic Tests: NUCLEAR MEDICINE PET SKULL BASE TO THIGH  TECHNIQUE: 5.0 mCi F-18 FDG was injected intravenously. Full-ring PET imaging was performed from the skull base to thigh after the radiotracer. CT data was obtained and used for attenuation correction and anatomic localization.  FASTING BLOOD GLUCOSE:  Value: 102 mg/dl  COMPARISON:  Neck CT dated 08/25/2016  FINDINGS: NECK  Just below the inferior margin of the right parotid gland there  is a presumed lateral cervical lymph node with maximum SUV of 5.1, but for correlation on the CT data possibly due to streak artifact from the patient's dental work. There is previously a 5 mm lateral cervical lymph node in this vicinity on prior CT neck from 08/25/2016.  CHEST  The right upper lobe nodule measures 2.7 by 1.7 cm on image 39/4, with maximum standard uptake value of 5.8. There is some surrounding ground-glass density as well as an adjacent 0.6 by 0.4 cm nodule on image 17/8 without appreciable associated independent activity.  Coronary, aortic arch, and branch vessel atherosclerotic vascular disease. No hypermetabolic adenopathy in the chest.  ABDOMEN/PELVIS  No abnormal hypermetabolic activity within the liver, pancreas, adrenal glands, or spleen. No hypermetabolic lymph nodes in the abdomen or pelvis.  Aortoiliac atherosclerotic vascular disease. Mild right renal atrophy compared to the left scattered sigmoid colon diverticula. No adrenal mass.  Calcification in the right kidney upper pole parenchyma on image 87/4, nonspecific but probably benign/incidental.  SKELETON  No focal hypermetabolic activity to suggest skeletal metastasis. There is evidence of lumbar spondylosis and degenerative disc disease along with grade 1 degenerative subluxations.  IMPRESSION: 1. Moderately hypermetabolic right upper lobe lung nodule, 2.7 cm in long axis,  maximum SUV 5.8, favoring malignancy. 2. In the neck just below the right parotid gland, there is a small focus of accentuated metabolic activity, SUV 5.1. There is not a good CT correlate on today's exam due to streak artifact from the patient's dental work, but on the recent CT neck from 08/25/2016 there is a small lateral cervical lymph node in this vicinity on image 138/7 measuring 5 mm in short axis. Although quite likely incidental, this lesion should probably be surveilled. 3.  Aortic Atherosclerosis (ICD10-I70.0).  Coronary atherosclerosis. 4. Lumbar spondylosis and degenerative disc disease. 5. Right renal atrophy.   Electronically Signed   By: Van Clines M.D.   On: 09/15/2016 13:46 I personally reviewed the PET CT images and concur with the findings noted above  Impression: [name] is an 83 year old woman who is a lifelong nonsmoker who presented with a 2 year history of a chronic dry cough. She was found to have a right upper lobe opacity. On PET CT she has a 2.7 cm right upper lobe nodule that is mixed solid and some solid with a small satellite nodule in vicinity. This is hypermetabolic with an SUV of 5.8. This most likely is a new primary bronchogenic carcinoma but granulomatous inflammation is in the differential.  She is in pretty good shape for her age but is 83 years old and I do think that age we should at least try to establish a diagnosis before proceeding directly to surgical resection. I recommended to her that we try a navigational bronchoscopy to biopsy the lesion to guide additional treatment. She will need a new CT of the chest with super dimension protocol for that procedure.  I discussed the general nature of the procedure with [name] and her friend who accompanied her. They understand this is diagnostic and not therapeutic. We would do it in the operating room under general anesthesia. She understands the risk of the procedure include those  associated with general anesthesia. I reviewed the indications, risks, benefits, and alternatives. She understands risks associated with the procedure including failure to make a diagnosis, pneumothorax, and bleeding. Also general risks of associated with invasive procedures and general anesthesia such as MI, stroke, DVT, PE and in rare instances death.  She  does need pulmonary function testing as we assess her fitness to undergo surgical resection.  Plan: Navigational bronchoscopy on Thursday, 10/12/2016  [name], MD [facility] [phone]

## 2016-10-25 NOTE — Brief Op Note (Signed)
10/25/2016  3:51 PM  PATIENT:  [name]  83 y.o. female  PRE-OPERATIVE DIAGNOSIS:  RUL NODULE  POST-OPERATIVE DIAGNOSIS:  RUL nodule  PROCEDURE:  Procedure(s): VIDEO BRONCHOSCOPY WITH ENDOBRONCHIAL NAVIGATION with biopsies (N/A) Brushings, Biopsies and Gen Cut needle aspirations  SURGEON:  Surgeon(s) and Role:    * [name], MD - Primary   ANESTHESIA:   general  EBL:  Total I/O In: 1061.3 [I.V.:1061.3] Out: 1 [Blood:1]  BLOOD ADMINISTERED:none  DRAINS: none   LOCAL MEDICATIONS USED:  NONE  SPECIMEN:  Source of Specimen:  RUL nodule  DISPOSITION OF SPECIMEN:  PATHOLOGY  COUNTS:  YES  TOURNIQUET:  * No tourniquets in log *  DICTATION: .Other Dictation: Dictation Number -  PLAN OF CARE: Discharge to home after PACU  PATIENT DISPOSITION:  PACU - hemodynamically stable.   Delay start of Pharmacological VTE agent (>24hrs) due to surgical blood loss or risk of bleeding: not applicable

## 2016-10-25 NOTE — Anesthesia Procedure Notes (Signed)
Procedure Name: Intubation Date/Time: 10/25/2016 1:40 PM Performed by: Carney Living Pre-anesthesia Checklist: Patient identified, Emergency Drugs available, Suction available, Patient being monitored and Timeout performed Patient Re-evaluated:Patient Re-evaluated prior to induction Oxygen Delivery Method: Circle system utilized Preoxygenation: Pre-oxygenation with 100% oxygen Induction Type: IV induction Ventilation: Mask ventilation without difficulty Laryngoscope Size: Mac Grade View: Grade I Tube size: 8.5 mm Number of attempts: 1 Airway Equipment and Method: Stylet Placement Confirmation: ETT inserted through vocal cords under direct vision,  positive ETCO2 and breath sounds checked- equal and bilateral Secured at: 20 cm Tube secured with: Tape Dental Injury: Teeth and Oropharynx as per pre-operative assessment

## 2016-10-25 NOTE — Op Note (Signed)
[name], [name]              ACCOUNT NO.:  [id number]  MEDICAL RECORD NO.:  [id number]  LOCATION:                                 FACILITY:  PHYSICIAN:  Revonda Standard. Roxan Hockey, M.D. DATE OF BIRTH:  DATE OF PROCEDURE:  10/25/2016 DATE OF DISCHARGE:                              OPERATIVE REPORT   PREOPERATIVE DIAGNOSIS:  Right upper lobe nodule.  POSTOPERATIVE DIAGNOSIS:  Non-small cell carcinoma.  PROCEDURE:  Electromagnetic navigational bronchoscopy with brushings, Transbronchial biopsies, and GenCut needle aspirations.  SURGEON:  Revonda Standard. Roxan Hockey, M.D.  ASSISTANT:  None.  ANESTHESIA:  General.  FINDINGS:  Very difficult to navigate to the lesion due to its medial location.  Initial specimen showed atypical cells.  A followup specimen showed non-small cell carcinoma.  CLINICAL NOTE:  [name] is an 83 year old woman, who presents with a 2-year history of cough.  Evaluation revealed an abnormality in the right upper lobe.  A PET-CT showed the nodule was hypermetabolic.  There was no hypermetabolic hilar or mediastinal adenopathy.  She was advised to undergo navigational bronchoscopy for diagnostic purposes.  The indications, risks, benefits, and alternatives were discussed in detail with the patient.  She understood and accepted the risks and agreed to proceed.  DESCRIPTION OF PROCEDURE:  [name] was brought to the operating room on October 25, 2016.  She had induction of general anesthesia.  The preoperative plan was loaded and probes were placed on the chest for the navigational bronchoscopy.  Flexible fiberoptic bronchoscopy was performed via the endotracheal tube.  It revealed normal endobronchial anatomy and no endobronchial lesions.  The locatable guide for navigation was placed and registration was performed.  There was good correlation of the video and virtual bronchoscopy.  An attempt then was made to navigate the locatable guide to the lesion.   This was an apical medial lesion in the right upper lobe and it was very difficult to get the locatable guide into position. Iinitially, the appropriate subsegmental bronchus could not be cannulated due to the angulation. The locatable guide was placed into an adjacent subsegmental bronchus and came within 1.5 cm of the lesion with good alignment.  Sampling was performed with a needle brush and biopsies were taken.  Needle brushings were sent for cytology.  The biopsies were sent for both AFB and fungal cultures and pathology.  These specimen showed a few atypical cells, but no definitive diagnosis could be made.  A second 180 degree sheath then was placed into cold water.  This sheath was advanced into the right upper lobe bronchus without the locatable guide in place.  It was then pulled back and advanced.  The locatable guide then was placed and the sheath was in the correct subsegmental bronchus.  The locatable guide was advanced within 1 cm of the lesion with good alignment and brushings and biopsies were repeated.  The locatable guide was periodically reinserted to ensure continued good alignment and actually came within 8 mm of the center of the lesion.  Additional biopsies were performed and then needle aspirations were performed with the GenCut device.  The second set of specimens did show non-small cell carcinoma. The locatable guide and sheath  were removed.  There was no ongoing bleeding from the bronchus.  The bronchoscope was removed.  The patient was extubated in the operating room and taken to the [facility] in good condition.  Total fluoroscopy time was 5.9 minutes.  Dose was 43.6 mGy.     Revonda Standard Roxan Hockey, M.D.     SCH/MEDQ  D:  10/25/2016  T:  10/25/2016  Job:  [id number]

## 2016-10-25 NOTE — Transfer of Care (Signed)
Immediate Anesthesia Transfer of Care Note  Patient: [name]  Procedure(s) Performed: Procedure(s): VIDEO BRONCHOSCOPY WITH ENDOBRONCHIAL NAVIGATION with biopsies (N/A)  Patient Location: PACU  Anesthesia Type:General  Level of Consciousness: awake, alert  and oriented  Airway & Oxygen Therapy: Patient Spontanous Breathing and Patient connected to nasal cannula oxygen  Post-op Assessment: Report given to RN, Post -op Vital signs reviewed and stable and Patient moving all extremities X 4  Post vital signs: Reviewed and stable  Last Vitals:  Vitals:   10/25/16 0945  BP: (!) 192/68  Pulse: 66  Resp: 16  Temp: 36.4 C  SpO2: 96%    Last Pain:  Vitals:   10/25/16 0945  TempSrc: Oral         Complications: No apparent anesthesia complications

## 2016-10-25 NOTE — Anesthesia Postprocedure Evaluation (Signed)
Anesthesia Post Note  Patient: [name]  Procedure(s) Performed: Procedure(s) (LRB): VIDEO BRONCHOSCOPY WITH ENDOBRONCHIAL NAVIGATION with biopsies (N/A)     Patient location during evaluation: PACU Anesthesia Type: General Level of consciousness: sedated Pain management: pain level controlled Vital Signs Assessment: post-procedure vital signs reviewed and stable Respiratory status: spontaneous breathing and respiratory function stable Cardiovascular status: stable Postop Assessment: no apparent nausea or vomiting Anesthetic complications: no    Last Vitals:  Vitals:   10/25/16 1600 10/25/16 1611  BP: (!) 162/58 (!) 160/65  Pulse: 74 77  Resp: 18 (!) 21  Temp:  36.7 C  SpO2: 94% 94%    Last Pain:  Vitals:   10/25/16 1611  TempSrc:   PainSc: 0-No pain                 Stewart Sasaki DANIEL

## 2016-10-25 NOTE — Discharge Instructions (Signed)
Do not drive or engage in heavy physical activity for 24 hours  You may resume normal activities tomorrow  You may cough up small amounts of blood over the next few days  You may use acetaminophen (Tylenol) if needed for pain. You may use over the counter cough medication if needed  Call [phone] if you develop chest pain, shortness of breath, fever > 101 F or cough up more than a tablespoon of blood  My office will contact you with a follow up appointment for early next week

## 2016-10-25 NOTE — Anesthesia Preprocedure Evaluation (Addendum)
Anesthesia Evaluation  Patient identified by MRN, date of birth, ID band Patient awake    Reviewed: Allergy & Precautions, NPO status , Patient's Chart, lab work & pertinent test results  Airway Mallampati: II  TM Distance: >3 FB Neck ROM: Full    Dental  (+) Teeth Intact, Dental Advisory Given   Pulmonary COPD,    breath sounds clear to auscultation       Cardiovascular hypertension, Pt. on medications and Pt. on home beta blockers  Rhythm:Regular Rate:Normal     Neuro/Psych    GI/Hepatic GERD  Medicated and Controlled,  Endo/Other    Renal/GU      Musculoskeletal  (+) Arthritis , Osteoarthritis,    Abdominal   Peds  Hematology   Anesthesia Other Findings   Reproductive/Obstetrics                           Anesthesia Physical Anesthesia Plan  ASA: III  Anesthesia Plan: General   Post-op Pain Management:    Induction: Intravenous  PONV Risk Score and Plan: Ondansetron and Dexamethasone  Airway Management Planned: Oral ETT  Additional Equipment:   Intra-op Plan:   Post-operative Plan: Extubation in OR  Informed Consent: I have reviewed the patients History and Physical, chart, labs and discussed the procedure including the risks, benefits and alternatives for the proposed anesthesia with the patient or authorized representative who has indicated his/her understanding and acceptance.   Dental advisory given  Plan Discussed with: CRNA, Anesthesiologist and Surgeon  Anesthesia Plan Comments:       Anesthesia Quick Evaluation

## 2016-10-26 ENCOUNTER — Encounter (HOSPITAL_COMMUNITY): Payer: Self-pay | Admitting: Thoracic Surgery (Cardiothoracic Vascular Surgery)

## 2016-10-26 LAB — ACID FAST SMEAR (AFB, MYCOBACTERIA): Acid Fast Smear: NEGATIVE

## 2016-10-31 ENCOUNTER — Ambulatory Visit: Payer: Medicare Other | Admitting: Thoracic Surgery (Cardiothoracic Vascular Surgery)

## 2016-11-02 ENCOUNTER — Encounter: Payer: Self-pay | Admitting: Thoracic Surgery (Cardiothoracic Vascular Surgery)

## 2016-11-02 ENCOUNTER — Ambulatory Visit (INDEPENDENT_AMBULATORY_CARE_PROVIDER_SITE_OTHER): Payer: Medicare Other | Admitting: Thoracic Surgery (Cardiothoracic Vascular Surgery)

## 2016-11-02 VITALS — BP 194/77 | HR 74 | Ht 60.0 in | Wt 102.0 lb

## 2016-11-02 DIAGNOSIS — C3491 Malignant neoplasm of unspecified part of right bronchus or lung: Secondary | ICD-10-CM

## 2016-11-02 NOTE — Progress Notes (Signed)
[address]       [place], [address]             [phone]     HPI: [name] returns to discuss results for navigational bronchoscopy.  [name] is an 83 year old nonsmoker who has a past history significant for severe refractory hypertension, anxiety, COPD, reflux, esophageal stricture, osteoporosis, and arthritis. She has a two-year history of a cough. She self-referred to Dr. Melvyn Novas. He did a PET/CT which showed a right upper lobe opacity which was hypermetabolic with an SUV of 5.8. I recommended a navigational bronchoscopy to establish a diagnosis before surgical resection.  I did a navigational bronchoscopy on 10/25/2016. The right upper lobe mass was positive for adenocarcinoma. She has no evidence of metastatic disease.  Past Medical History:  Diagnosis Date  . Anxiety   . Anxiety disorder   . Arthritis   . Carotid stenosis   . Complication of anesthesia   . COPD (chronic obstructive pulmonary disease) ([place])   . 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 Esophageal stricture   . GERD (gastroesophageal reflux disease)   . History of gastritis   . History of phlebitis   . Hypertension   . Lung nodule    Right Upper  . Osteoporosis   . Pneumonia   . PONV (postoperative nausea and vomiting)   . UTI (urinary tract infection)     Current Outpatient Prescriptions  Medication Sig Dispense Refill  . acetaminophen (TYLENOL) 500 MG tablet Take 500 mg by mouth every 8 (eight) hours as needed for mild pain or headache.    Marland Kitchen amLODipine (NORVASC) 10 MG tablet Take 10 mg by mouth daily.    Marland Kitchen aspirin EC 81 MG tablet Take 81 mg by mouth daily.    Marland Kitchen atenolol (TENORMIN) 25 MG tablet Take 25 mg by mouth daily.     Marland Kitchen CRANBERRY PO Take 4,200 mg by mouth daily.     Marland Kitchen denosumab (PROLIA) 60 MG/ML SOLN injection Inject 60 mg into the skin every 6 (six) months. Administer in upper arm, thigh, or abdomen  Next dose is 04-2017    . fluticasone (FLONASE)  50 MCG/ACT nasal spray Place 1 spray into both nostrils daily as needed for allergies or rhinitis.    Marland Kitchen losartan (COZAAR) 50 MG tablet Take 50 mg by mouth daily.     Marland Kitchen omeprazole (PRILOSEC) 40 MG capsule Take 40 mg by mouth daily before breakfast.     . pravastatin (PRAVACHOL) 20 MG tablet Take 20 mg by mouth at bedtime.     . pyridOXINE (VITAMIN B-6) 100 MG tablet Take 100 mg by mouth daily.    . sertraline (ZOLOFT) 50 MG tablet Take 25 mg by mouth 2 (two) times daily.     . vitamin E 400 UNIT capsule Take 400 Units by mouth daily.       No current facility-administered medications for this visit.     Physical Exam BP (!) 194/77   Pulse 74   Ht 5' (1.524 m)   Wt 102 lb (46.3 kg)   SpO2 95%   BMI 19.34 kg/m  83 year old woman in no acute distress Alert and oriented 3 with no focal deficits Lungs mildly decreased breath sounds bilaterally, no rales or wheezing Cardiac regular rate and rhythm normal S1 and S2 No cervical or supraclavicular adenopathy Abdomen soft nontender Extremities without clubbing cyanosis or edema  Diagnostic Tests: I again reviewed her  CT chest, PET/CT and pulmonary function testing  Impression: [name] is an 83 year old nonsmoker with a two-year history of cough who has an adenocarcinoma of the right upper lobe. This appears to be stage I disease radiographically.  I informed [name] and her friend of the diagnosis. She seemed surprised despite our previous conversations. I discussed to potential modes of treatment one being surgical resection and the other being radiation therapy. She previously had not been interested in radiation at all. I discussed the relative advantages and disadvantages of each of those and went into detail regarding the nature of VATS lobectomy including the incisions to be used, the need for general anesthesia, the use of drainage tubes postoperatively, the expected hospital stay, and the overall recovery. I informed her of  the indications, risks, benefits, and alternatives. She understands the risks include, but are not limited to death, MI, DVT, PE, bleeding, possible need for transfusion, stroke, infection, prolonged air leak, cardiac arrhythmias, chronic pain, as well as possibility of other unforeseeable complications.  Previously she had been very enthusiastic about surgical resection. Today she is a little hesitant to consider it and wants to talk to one of the radiation oncologist before she makes a decision. I will arrange for that.  Plan: Referral to radiation oncology for consultation.  If she chooses to pursue surgical resection no further workup will be necessary before hand.  [name], MD [facility] [phone]

## 2016-11-03 ENCOUNTER — Telehealth: Payer: Self-pay | Admitting: *Deleted

## 2016-11-03 NOTE — Telephone Encounter (Signed)
Oncology Nurse Navigator Documentation  Oncology Nurse Navigator Flowsheets 11/03/2016  Navigator Location [facility]  Navigator Encounter Type Telephone/I received referral yesterday on Ms. Cheron Schaumann from Dr. Leonarda Salon office. I called today to schedule her to be seen at [facility].  I was unable to reach her but did leave a vm message with my name and phone number to call.   Telephone Outgoing Call  Treatment Phase Pre-Tx/Tx Discussion  Barriers/Navigation Needs Coordination of Care  Interventions Coordination of Care  Coordination of Care Other  Acuity Level 1  Time Spent with Patient 15

## 2016-11-03 NOTE — Telephone Encounter (Signed)
Oncology Nurse Navigator Documentation  Oncology Nurse Navigator Flowsheets 11/03/2016  Navigator Location [facility]  Navigator Encounter Type Telephone/I called [name] and gave her an appt to be seen on 11/09/16 arrive at 1:45.  She verbalized understanding of appt time and place.   Telephone Outgoing Call  Treatment Phase Pre-Tx/Tx Discussion  Barriers/Navigation Needs Coordination of Care  Interventions Coordination of Care  Coordination of Care Appts  Acuity Level 1  Time Spent with Patient 15

## 2016-11-09 ENCOUNTER — Ambulatory Visit
Admission: RE | Admit: 2016-11-09 | Discharge: 2016-11-09 | Disposition: A | Discharge status: Home / Self Care | Payer: Medicare Other | Source: Ambulatory Visit | Attending: Radiation Oncology | Admitting: Radiation Oncology

## 2016-11-09 ENCOUNTER — Ambulatory Visit: Payer: Medicare Other | Attending: Radiation Oncology | Admitting: Physical Therapy

## 2016-11-09 ENCOUNTER — Encounter: Payer: Self-pay | Admitting: *Deleted

## 2016-11-09 VITALS — BP 225/66 | HR 74 | Temp 97.7°F | Resp 18 | Wt 101.6 lb

## 2016-11-09 DIAGNOSIS — Z9889 Other specified postprocedural states: Secondary | ICD-10-CM | POA: Insufficient documentation

## 2016-11-09 DIAGNOSIS — I700 Atherosclerosis of aorta: Secondary | ICD-10-CM | POA: Insufficient documentation

## 2016-11-09 DIAGNOSIS — Z79899 Other long term (current) drug therapy: Secondary | ICD-10-CM | POA: Insufficient documentation

## 2016-11-09 DIAGNOSIS — D0512 Intraductal carcinoma in situ of left breast: Secondary | ICD-10-CM

## 2016-11-09 DIAGNOSIS — Z510 Encounter for antineoplastic radiation therapy: Secondary | ICD-10-CM | POA: Insufficient documentation

## 2016-11-09 DIAGNOSIS — R2689 Other abnormalities of gait and mobility: Secondary | ICD-10-CM | POA: Diagnosis not present

## 2016-11-09 DIAGNOSIS — C3411 Malignant neoplasm of upper lobe, right bronchus or lung: Secondary | ICD-10-CM | POA: Insufficient documentation

## 2016-11-09 DIAGNOSIS — Z882 Allergy status to sulfonamides status: Secondary | ICD-10-CM | POA: Insufficient documentation

## 2016-11-09 DIAGNOSIS — Z888 Allergy status to other drugs, medicaments and biological substances status: Secondary | ICD-10-CM | POA: Insufficient documentation

## 2016-11-09 DIAGNOSIS — J449 Chronic obstructive pulmonary disease, unspecified: Secondary | ICD-10-CM | POA: Insufficient documentation

## 2016-11-09 DIAGNOSIS — Z923 Personal history of irradiation: Secondary | ICD-10-CM | POA: Insufficient documentation

## 2016-11-09 DIAGNOSIS — K219 Gastro-esophageal reflux disease without esophagitis: Secondary | ICD-10-CM | POA: Insufficient documentation

## 2016-11-09 DIAGNOSIS — C3491 Malignant neoplasm of unspecified part of right bronchus or lung: Secondary | ICD-10-CM | POA: Insufficient documentation

## 2016-11-09 DIAGNOSIS — Z803 Family history of malignant neoplasm of breast: Secondary | ICD-10-CM | POA: Insufficient documentation

## 2016-11-09 DIAGNOSIS — Z808 Family history of malignant neoplasm of other organs or systems: Secondary | ICD-10-CM | POA: Insufficient documentation

## 2016-11-09 DIAGNOSIS — F419 Anxiety disorder, unspecified: Secondary | ICD-10-CM | POA: Insufficient documentation

## 2016-11-09 DIAGNOSIS — Z7982 Long term (current) use of aspirin: Secondary | ICD-10-CM | POA: Insufficient documentation

## 2016-11-09 DIAGNOSIS — I10 Essential (primary) hypertension: Secondary | ICD-10-CM | POA: Insufficient documentation

## 2016-11-09 NOTE — Progress Notes (Signed)
Radiation Oncology         [phone] ________________________________  Initial Outpatient Consultation  Name: [name] MRN: [id number]  Date: 11/09/2016  DOB: December 31, 1929  YS:[id number], Lupita Raider, PA  [name], *   REFERRING PHYSICIAN: [name], *  DIAGNOSIS:  Clinical stage I Adenocarcinoma of the upper lobe of the right lung.  HISTORY OF PRESENT ILLNESS::[name] is a 83 y.o. female who is here to discuss of her right upper lobe lung cancer. She is a lifelong non-smoker but has significant second-hand exposure. She presents into clinic today with her friends. Initially, patient presented to her pulmonologist complaining of a chronic cough for two years. A chest CT revealed a large nodule of the right upper lobe measuring 5.3 x 2.8. 2.6 cm. Dr. Melvyn Novas then ordered a PET scan on 09/15/2016 revealing a moderately hypermetabolic right upper lobe lung nodule. She additionally had a navigational bronchoscopy performed, biopsy from this found scant adenocarcinoma of the right upper lobe. Additionally, transbronchial needle aspiration was positive for malignant cells consistent with adenocarcinoma. She saw Dr. Roxan Hockey again recently and was referred into radiation oncology for further treatment options other than surgery due to her age. She is not on home oxygen therapy. She denies any problems with her past radiation therapy. She denies any past abdominal surgeries.  Of note, the patient did have DCIS of the left breast   approximately 10 years ago. Pathology revealed this to be DCIS and she subsequently underwent 18 radiotherapy treatments(Dr Tammi Klippel). She continues to follow-up with Dr. Georgette Dover.  On review of systems, pt denies orthopnea, back pain or any other complaints at this time.  PREVIOUS RADIATION THERAPY: Yes, left breast mass approximately 10 years ago.  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    PAST SURGICAL HISTORY: Past Surgical History:  Procedure Laterality Date  . BREAST CYST EXCISION     left  . CATARACT EXTRACTION     bilateral  . CYSTECTOMY     Left knee  . EYE SURGERY    . KNEE ARTHROSCOPY     right/ Pt can not recall  . VIDEO BRONCHOSCOPY WITH ENDOBRONCHIAL NAVIGATION N/A 10/25/2016   Procedure: VIDEO BRONCHOSCOPY WITH ENDOBRONCHIAL NAVIGATION with biopsies;  Surgeon: [name], MD;  [facility];  Service: Thoracic;  Laterality: N/A;    FAMILY HISTORY: family history includes Breast cancer in her mother and sister; Uterine cancer in her unknown relative.  SOCIAL HISTORY:  reports that she has never smoked. She has never used smokeless tobacco. She reports that she drinks alcohol. She reports that she does not use drugs.  ALLERGIES: Propoxyphene n-acetaminophen; Sulfonamide derivatives; and Sulfur  MEDICATIONS:  Current Outpatient Prescriptions  Medication Sig Dispense Refill  . acetaminophen (TYLENOL) 500 MG tablet Take 500 mg by mouth every 8 (eight) hours as needed for mild pain or headache.    Marland Kitchen amLODipine (NORVASC) 10 MG tablet Take 10 mg by mouth daily.    Marland Kitchen aspirin EC 81 MG tablet Take 81 mg by mouth daily.    Marland Kitchen atenolol (TENORMIN) 25 MG tablet Take 25 mg by mouth daily.     Marland Kitchen CRANBERRY PO  Take 4,200 mg by mouth daily.     Marland Kitchen denosumab (PROLIA) 60 MG/ML SOLN injection Inject 60 mg into the skin every 6 (six) months. Administer in upper arm, thigh, or abdomen  Next dose is 04-2017    . fluticasone (FLONASE) 50 MCG/ACT nasal spray Place 1 spray into both nostrils  daily as needed for allergies or rhinitis.    Marland Kitchen losartan (COZAAR) 50 MG tablet Take 50 mg by mouth daily.     Marland Kitchen omeprazole (PRILOSEC) 40 MG capsule Take 40 mg by mouth daily before breakfast.     . pravastatin (PRAVACHOL) 20 MG tablet Take 20 mg by mouth at bedtime.     . pyridOXINE (VITAMIN B-6) 100 MG tablet Take 100 mg by mouth daily.    . sertraline (ZOLOFT) 50 MG tablet Take 25 mg by mouth 2 (two) times daily.     . vitamin E 400 UNIT capsule Take 400 Units by mouth daily.       No current facility-administered medications for this encounter.     REVIEW OF SYSTEMS:  A 10+ POINT REVIEW OF SYSTEMS WAS OBTAINED including neurology, dermatology, psychiatry, cardiac, respiratory, lymph, extremities, GI, GU, Musculoskeletal, constitutional, breasts, reproductive, HEENT.  All pertinent positives are noted in the HPI.  All others are negative.   PHYSICAL EXAM:  weight is 101 lb 9.6 oz (46.1 kg). Her oral temperature is 97.7 F (36.5 C). Her blood pressure is 225/66 (abnormal) and her pulse is 74. Her respiration is 18 and oxygen saturation is 98%.   General: Alert and oriented, in no acute distress HEENT: Head is normocephalic. Extraocular movements are intact. Oropharynx is clear. Neck: Neck is supple, no palpable cervical or supraclavicular lymphadenopathy. Heart: Regular in rate and rhythm with no murmurs, rubs, or gallops. Chest: Clear to auscultation bilaterally, with no rhonchi, wheezes, or rales. Abdomen: Soft, nontender, nondistended, with no rigidity or guarding. Extremities: No cyanosis or edema. Lymphatics: see Neck Exam Skin: No concerning lesions. Musculoskeletal: symmetric strength and muscle tone throughout. Neurologic: Cranial nerves II through XII are grossly intact. No obvious focalities. Speech is fluent. Coordination is intact. Psychiatric: Judgment and insight are intact. Affect is appropriate.   ECOG = 0  LABORATORY DATA:  Lab Results  Component Value Date   WBC  6.1 10/25/2016   HGB 12.2 10/25/2016   HCT 35.5 (L) 10/25/2016   MCV 87.4 10/25/2016   PLT 183 10/25/2016   NEUTROABS 4.4 09/04/2016   Lab Results  Component Value Date   NA 139 10/25/2016   K 4.0 10/25/2016   CL 107 10/25/2016   CO2 28 10/25/2016   GLUCOSE 97 10/25/2016   CREATININE 1.08 (H) 10/25/2016   CALCIUM 9.1 10/25/2016      RADIOGRAPHY: Dg Chest 2 View  Result Date: 10/25/2016 CLINICAL DATA:  Right lung nodule. EXAM: CHEST  2 VIEW COMPARISON:  CT scan of October 10, 2016. Radiographs of December 09, 2016. FINDINGS: The heart size and mediastinal contours are within normal limits. Atherosclerosis of thoracic aorta is noted. No pneumothorax or pleural effusion is noted. Left lung is clear. Stable right upper lobe nodule is noted. The visualized skeletal structures are unremarkable. IMPRESSION: Aortic atherosclerosis. Stable right upper lobe nodule. No other abnormality seen in the chest. Electronically Signed   By: Marijo Conception, M.D.   On: 10/25/2016 10:29   Dg C-arm Bronchoscopy  Result Date: 10/25/2016 C-ARM BRONCHOSCOPY: Fluoroscopy was utilized by the requesting physician.  No radiographic interpretation.  IMPRESSION: [name] is a wonderful 83 y.o. female who presents today to discuss the role of radiotherapy in the ongoing management of her clinical stage I non-small cell adenocarcinoma of the right upper lung. She was referred to [place] from Dr Roxan Hockey to discuss the role of radiation therapy vs surgery. I informed her that while surgery would be the more favorable option for curative purposes, she is at a higher risk for complications due to her age. I did inform her that she would be a good candidate for SBRT and that this would be slightly inferior to  the curative rate of surgery.   We will plan for 3-5 treatments. Explained to patient the different side effects regarding treatment. She is still debating between potentially having the surgery or  radiation therapy at this time. I also informed her we would like to complete her staging imaging including MRI Brain prior to beginning her treatments.   PLAN: We will await for the patient to decide her next course of treatment. She will call us with her decision in the near future.    ------------------------------------------------  Blair Promise, PhD, MD  This document serves as a record of services personally performed by Gery Pray, MD. It was created on her behalf by Marlowe Kays, a trained medical scribe. The creation of this record is based on the scribe's personal observations and the provider's statements to them. This document has been checked and approved by the attending provider.

## 2016-11-09 NOTE — Progress Notes (Signed)
Patient's blood pressure taken at end of [place] appointment and the systolic BP came down to 185/909.  Patient asymptomatic.

## 2016-11-10 NOTE — Therapy (Signed)
[name], [place], [address] Phone: [phone]   Fax:  [phone]  Physical Therapy Evaluation  Patient Details  Name: [name] MRN: [id number] Date of Birth: [birth year] Referring Provider: Dr. Gery Pray  Encounter Date: 11/09/2016      PT End of Session - 11/10/16 1334    Visit Number 1   Number of Visits 1   PT Start Time 7948   PT Stop Time 1510   PT Time Calculation (min) 26 min   Activity Tolerance Patient tolerated treatment well   Behavior During Therapy [facility] for tasks assessed/performed      Past Medical History:  Diagnosis Date  . Anxiety   . Anxiety disorder   . Arthritis   . Carotid stenosis   . Complication of anesthesia   . COPD (chronic obstructive pulmonary disease) ([place])   . 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 Esophageal stricture   . GERD (gastroesophageal reflux disease)   . History of gastritis   . History of phlebitis   . Hypertension   . Lung nodule    Right Upper  . Osteoporosis   . Pneumonia   . PONV (postoperative nausea and vomiting)   . UTI (urinary tract infection)     Past Surgical History:  Procedure Laterality Date  . BREAST CYST EXCISION     left  . CATARACT EXTRACTION     bilateral  . CYSTECTOMY     Left knee  . EYE SURGERY    . KNEE ARTHROSCOPY     right/ Pt can not recall  . VIDEO BRONCHOSCOPY WITH ENDOBRONCHIAL NAVIGATION N/A 10/25/2016   Procedure: VIDEO BRONCHOSCOPY WITH ENDOBRONCHIAL NAVIGATION with biopsies;  Surgeon: [name], MD;  Location: [place];  Service: Thoracic;  Laterality: N/A;    There were no vitals filed for this visit.       Subjective Assessment - 11/10/16 0816    Subjective Says she gets a little short of breath walking uphill.   Patient is accompained by: --  2 close friends   Pertinent History Pt. presented with cough, and workup showed a lung mass. Diagnosis is scant  adenocarcinoma of right upper lobe, 2.7 cm. nodule.  She is a marginal candidate for surgery, and is considering XRT as an alternative. Never smoker. HTN (significant). h/o breast CA in situ s/p lumpectomy and XRT 2008.  Osteoporosis, arthritis, carotid stensosis, COPD.   Patient Stated Goals get info from all lung clinic providers   Currently in Pain? No/denies            [facility] PT Assessment - 11/10/16 0001      Assessment   Medical Diagnosis 2.7 cm. rightupper lobe adenocarcinoma   Referring Provider Dr. Gery Pray   Onset Date/Surgical Date 08/30/16   Hand Dominance Right   Prior Therapy none     Precautions   Precaution Comments active cancer     Restrictions   Weight Bearing Restrictions No     Balance Screen   Has the patient fallen in the past 6 months No   Has the patient had a decrease in activity level because of a fear of falling?  No   Is the patient reluctant to leave their home because of a fear of falling?  No     Home Environment   Living Environment Private residence   Living Arrangements Alone   Type of [place] Layout Multi-level  Additional Comments stairs all have railings, and she uses them     Prior Function   Level of Independence Independent   Leisure reports she walks around her driveway for 25 minutes 3x/week at a good pace     Cognition   Overall Cognitive Status Within Functional Limits for tasks assessed     Observation/Other Assessments   Observations petite woman who looks and acts younger than her age of 60 accompanied by two friends     Coordination   Gross Motor Movements are Fluid and Coordinated Yes     Functional Tests   Functional tests Sit to Stand     Sit to Stand   Comments 11 times in 30 seconds, above average for age     Posture/Postural Control   Posture/Postural Control No significant limitations     ROM / Strength   AROM / PROM / Strength AROM     AROM   Overall AROM Comments Standing trunk AROM:  flexion--reaches fingertips 8 inches to floor; extension and sidebend bilat. WFL; rotation 15% loss bilat.     Ambulation/Gait   Ambulation/Gait Yes   Ambulation/Gait Assistance 7: Independent  per her report     Balance   Balance Assessed Yes     Dynamic Standing Balance   Dynamic Standing - Comments reaches forward 7 inches in standing, below average for age            Objective measurements completed on examination: See above findings.                  PT Education - 11/10/16 1334    Education provided Yes   Education Details energy conservation, walking program, CURE article on staying active, "Why exercise?" flyer, posture, breathing, PT info   Person(s) Educated Patient;Other (comment)  2 friends   Methods Explanation;Handout   Comprehension Verbalized understanding               Lung Clinic Goals - 11/10/16 1339      Patient will be able to verbalize understanding of the benefit of exercise to decrease fatigue.   Status Achieved     Patient will be able to verbalize the importance of posture.   Status Achieved     Patient will be able to demonstrate diaphragmatic breathing for improved lung function.   Status Achieved     Patient will be able to verbalize understanding of the role of physical therapy to prevent functional decline and who to contact if physical therapy is needed.   Status Achieved              Plan - 11/10/16 1335    Clinical Impression Statement This is an engaged woman who appears younger than her age of 61. She has a diagnosis of right upper lobe adenocarcinoma and is weighing whether to have XRT or surgical resection.  She does have some medical conditions.  She shows mildly limited trunk ROM and decreased forward heach in standing, but did better than average on 30 second sit to stand.  She does report some shortness of breath with walking uphill.   History and Personal Factors relevant to plan of care: h/o  breast cancer, osteoporosis, arthritis, COPD   Clinical Presentation Evolving   Clinical Presentation due to: just embarking on treatment for lung cancer   Clinical Decision Making Moderate   Rehab Potential Good   PT Frequency One time visit   PT Treatment/Interventions Patient/family education   PT Next Visit Plan  None at this time   PT Home Exercise Plan walking program, breathing exercise   Consulted and Agree with Plan of Care Patient      Patient will benefit from skilled therapeutic intervention in order to improve the following deficits and impairments:  Cardiopulmonary status limiting activity, Impaired flexibility  Visit Diagnosis: Other abnormalities of gait and mobility - Plan: PT plan of care cert/re-cert  Primary adenocarcinoma of right lung [facility]) - Plan: PT plan of care cert/re-cert      G-Codes - 50/38/88 1339    Functional Assessment Tool Used (Outpatient Only) clinical judgement   Functional Limitation Mobility: Walking and moving around   Mobility: Walking and Moving Around Current Status [phone]) At least 1 percent but less than 20 percent impaired, limited or restricted   Mobility: Walking and Moving Around Goal Status [phone]) At least 1 percent but less than 20 percent impaired, limited or restricted   Mobility: Walking and Moving Around Discharge Status [phone]) At least 1 percent but less than 20 percent impaired, limited or restricted       Problem List Patient Active Problem List   Diagnosis Date Noted  . Primary cancer of right upper lobe of lung ([place]) 11/09/2016  . Breast cancer in situ 09/05/2016  . Pulmonary infiltrate in right lung on chest x-ray 09/04/2016  . 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 ANXIETY DISORDER 05/14/2008  . Essential hypertension 05/14/2008  . PHLEBITIS 05/14/2008  . COPD 05/14/2008  . ESOPHAGEAL STRICTURE 05/14/2008  . GERD 05/14/2008  . ARTHRITIS 05/14/2008  . OSTEOPOROSIS 05/14/2008  . GASTRITIS, HX OF  05/14/2008    Chania Kochanski 11/10/2016, 1:41 PM  [place], [place], [address] Phone: [phone]   Fax:  [phone]  Name: [name] MRN: [id number] Date of Birth: [birth year]  Serafina Royals, PT 11/10/16 1:41 PM

## 2016-11-13 ENCOUNTER — Other Ambulatory Visit: Payer: Self-pay | Admitting: Radiation Oncology

## 2016-11-13 ENCOUNTER — Telehealth: Payer: Self-pay | Admitting: Oncology

## 2016-11-13 DIAGNOSIS — C3411 Malignant neoplasm of upper lobe, right bronchus or lung: Secondary | ICD-10-CM

## 2016-11-13 NOTE — Telephone Encounter (Signed)
[name],        Please inform patient that brain MRI has been ordered to complete staging workup.

## 2016-11-13 NOTE — Telephone Encounter (Signed)
Patient's friend, Katharine Look, called and said [name] has decided to go ahead with radiation.  She also said that she needs a brain scan first and is wondering how to get this ordered.  Advised her that Dr. Sondra Come will be contacted and we will call back.

## 2016-11-13 NOTE — Telephone Encounter (Signed)
Called [name] back and advised her that an MRI brain has been ordered by Dr. Sondra Come and that they should be called in the next few days with the appointment time.  She verbalized understanding and agreement.

## 2016-11-15 ENCOUNTER — Telehealth: Payer: Self-pay | Admitting: *Deleted

## 2016-11-15 NOTE — Telephone Encounter (Signed)
CALLED PATIENT TO INFORM OF MRI FOR 11-21-16 - ARRIVAL TIME - 12:45 PM FOR 1:00 PM TEST, NO RESTRICTIONS TO TEST, SPOKE WITH PATIENT AND SHE IS AWARE OF THIS TEST

## 2016-11-20 ENCOUNTER — Encounter: Payer: Self-pay | Admitting: *Deleted

## 2016-11-20 NOTE — Progress Notes (Signed)
Oncology Nurse Navigator Documentation  Oncology Nurse Navigator Flowsheets 11/20/2016  Navigator Location [facility]  Navigator Encounter Type Other/I contacted  Dr. Sondra Come regarding SIM appt.   Treatment Phase Pre-Tx/Tx Discussion  Barriers/Navigation Needs Coordination of Care  Interventions Coordination of Care  Coordination of Care Other  Acuity Level 1  Time Spent with Patient 30

## 2016-11-21 ENCOUNTER — Telehealth: Payer: Self-pay | Admitting: Oncology

## 2016-11-21 ENCOUNTER — Ambulatory Visit (HOSPITAL_COMMUNITY)
Admission: RE | Admit: 2016-11-21 | Discharge: 2016-11-21 | Disposition: A | Discharge status: Home / Self Care | Payer: Medicare Other | Source: Ambulatory Visit | Attending: Radiation Oncology | Admitting: Radiation Oncology

## 2016-11-21 DIAGNOSIS — C3411 Malignant neoplasm of upper lobe, right bronchus or lung: Secondary | ICD-10-CM | POA: Diagnosis not present

## 2016-11-21 DIAGNOSIS — I6389 Other cerebral infarction: Secondary | ICD-10-CM | POA: Diagnosis not present

## 2016-11-21 DIAGNOSIS — M064 Inflammatory polyarthropathy: Secondary | ICD-10-CM | POA: Diagnosis not present

## 2016-11-21 DIAGNOSIS — G319 Degenerative disease of nervous system, unspecified: Secondary | ICD-10-CM | POA: Diagnosis not present

## 2016-11-21 DIAGNOSIS — R9082 White matter disease, unspecified: Secondary | ICD-10-CM | POA: Diagnosis not present

## 2016-11-21 MED ORDER — GADOBENATE DIMEGLUMINE 529 MG/ML IV SOLN: 10.0000 mL | Freq: Once | INTRAVENOUS | Status: DC | PRN

## 2016-11-21 NOTE — Telephone Encounter (Signed)
Left message for patient regarding her 10:00 nurse eval appointment and 10:30 CT Simulation appointment tomorrow.  Requested a return call.

## 2016-11-22 ENCOUNTER — Ambulatory Visit
Admission: RE | Admit: 2016-11-22 | Discharge: 2016-11-22 | Disposition: A | Discharge status: Home / Self Care | Payer: Medicare Other | Source: Ambulatory Visit | Attending: Radiation Oncology | Admitting: Radiation Oncology

## 2016-11-22 DIAGNOSIS — Z803 Family history of malignant neoplasm of breast: Secondary | ICD-10-CM | POA: Diagnosis not present

## 2016-11-22 DIAGNOSIS — Z808 Family history of malignant neoplasm of other organs or systems: Secondary | ICD-10-CM | POA: Diagnosis not present

## 2016-11-22 DIAGNOSIS — F419 Anxiety disorder, unspecified: Secondary | ICD-10-CM | POA: Diagnosis not present

## 2016-11-22 DIAGNOSIS — Z79899 Other long term (current) drug therapy: Secondary | ICD-10-CM | POA: Diagnosis not present

## 2016-11-22 DIAGNOSIS — Z7982 Long term (current) use of aspirin: Secondary | ICD-10-CM | POA: Diagnosis not present

## 2016-11-22 DIAGNOSIS — C3411 Malignant neoplasm of upper lobe, right bronchus or lung: Secondary | ICD-10-CM

## 2016-11-22 DIAGNOSIS — Z882 Allergy status to sulfonamides status: Secondary | ICD-10-CM | POA: Diagnosis not present

## 2016-11-22 DIAGNOSIS — J449 Chronic obstructive pulmonary disease, unspecified: Secondary | ICD-10-CM | POA: Diagnosis not present

## 2016-11-22 DIAGNOSIS — K219 Gastro-esophageal reflux disease without esophagitis: Secondary | ICD-10-CM | POA: Diagnosis not present

## 2016-11-22 DIAGNOSIS — Z510 Encounter for antineoplastic radiation therapy: Secondary | ICD-10-CM | POA: Diagnosis present

## 2016-11-22 DIAGNOSIS — I10 Essential (primary) hypertension: Secondary | ICD-10-CM | POA: Diagnosis not present

## 2016-11-22 DIAGNOSIS — Z888 Allergy status to other drugs, medicaments and biological substances status: Secondary | ICD-10-CM | POA: Diagnosis not present

## 2016-11-22 DIAGNOSIS — Z923 Personal history of irradiation: Secondary | ICD-10-CM | POA: Diagnosis not present

## 2016-11-22 DIAGNOSIS — I700 Atherosclerosis of aorta: Secondary | ICD-10-CM | POA: Diagnosis not present

## 2016-11-22 DIAGNOSIS — Z9889 Other specified postprocedural states: Secondary | ICD-10-CM | POA: Diagnosis not present

## 2016-11-22 MED ORDER — GADOBENATE DIMEGLUMINE 529 MG/ML IV SOLN
10.0000 mL | Freq: Once | INTRAVENOUS | Status: AC | PRN
  Administered 2016-11-21: 9 mL via INTRAVENOUS

## 2016-11-22 NOTE — Progress Notes (Signed)
  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   NARRATIVE:  The patient was brought to the [facility].  Identity was confirmed.  All relevant records and images related to the planned course of therapy were reviewed.  The patient freely provided informed written consent to proceed with treatment after reviewing the details related to the planned course of therapy. The consent form was witnessed and verified by the simulation staff.  Then, the patient was set-up in a stable reproducible  supine position for radiation therapy.  A BodyFix immobilization pillow was fabricated for reproducible positioning.  Then I personally applied the abdominal compression paddle to limit respiratory excursion.  4D respiratoy motion management CT images were obtained.  Surface markings were placed.  The CT images were loaded into the planning software.  Then, using Cine, MIP, and standard views, the internal target volume (ITV) and planning target volumes (PTV) were delinieated, and avoidance structures were contoured.  Treatment planning then occurred.  The radiation prescription was entered and confirmed.  A total of two complex treatment devices were fabricated in the form of the BodyFix immobilization pillow and a neck accuform cushion.  I have requested : 3D Simulation  I have requested a DVH of the following structures: Heart, Lungs, Esophagus, Chest Wall, Brachial Plexus, Major Blood Vessels, and targets.  PLAN:  The patient will receive 50 Gy in 5 fractions.  -----------------------------------  Blair Promise, PhD, MD  This document serves as a record of services personally performed by Gery Pray, MD. It was created on his behalf by  Reola Mosher, a trained medical scribe. The creation of this record is based on the scribe's personal observations and the provider's statements to them. This document has been checked and approved by the attending provider.

## 2016-11-23 LAB — FUNGAL ORGANISM REFLEX

## 2016-11-23 LAB — FUNGUS CULTURE WITH STAIN

## 2016-11-23 LAB — FUNGUS CULTURE RESULT

## 2016-11-28 DIAGNOSIS — Z510 Encounter for antineoplastic radiation therapy: Secondary | ICD-10-CM | POA: Diagnosis not present

## 2016-11-30 ENCOUNTER — Ambulatory Visit
Admission: RE | Admit: 2016-11-30 | Discharge: 2016-11-30 | Disposition: A | Discharge status: Home / Self Care | Payer: Medicare Other | Source: Ambulatory Visit | Attending: Radiation Oncology | Admitting: Radiation Oncology

## 2016-11-30 DIAGNOSIS — C3411 Malignant neoplasm of upper lobe, right bronchus or lung: Secondary | ICD-10-CM

## 2016-11-30 DIAGNOSIS — Z510 Encounter for antineoplastic radiation therapy: Secondary | ICD-10-CM | POA: Diagnosis not present

## 2016-11-30 NOTE — Progress Notes (Signed)
  Radiation Oncology         [phone] ________________________________  Name: [name] MRN: [id number]  Date: 11/30/2016  DOB: 01/18/1930  Stereotactic Body Radiotherapy Treatment Procedure Note  NARRATIVE:  [name] was brought to the stereotactic radiation treatment machine and placed supine on the CT couch. The patient was set up for stereotactic body radiotherapy on the body fix pillow.  3D TREATMENT PLANNING AND DOSIMETRY:  The patient's radiation plan was reviewed and approved prior to starting treatment.  It showed 3-dimensional radiation distributions overlaid onto the planning CT.  [facility] for the target structures as well as the organs at risk were reviewed. The documentation of this is filed in the radiation oncology EMR.  SIMULATION VERIFICATION:  The patient underwent CT imaging on the treatment unit.  These were carefully aligned to document that the ablative radiation dose would cover the target volume and maximally spare the nearby organs at risk according to the planned distribution.  SPECIAL TREATMENT PROCEDURE: [name] received high dose ablative stereotactic body radiotherapy to the planned target volume without unforeseen complications. Treatment was delivered uneventfully. The high doses associated with stereotactic body radiotherapy and the significant potential risks require careful treatment set up and patient monitoring constituting a special treatment procedure   STEREOTACTIC TREATMENT MANAGEMENT:  Following delivery, the patient was evaluated clinically. The patient tolerated treatment without significant acute effects, and was discharged to home in stable condition.    PLAN: Continue treatment as planned.  ________________________________  Blair Promise, PhD, MD

## 2016-12-04 ENCOUNTER — Ambulatory Visit
Admission: RE | Admit: 2016-12-04 | Discharge: 2016-12-04 | Disposition: A | Discharge status: Home / Self Care | Payer: Medicare Other | Source: Ambulatory Visit | Attending: Radiation Oncology | Admitting: Radiation Oncology

## 2016-12-04 DIAGNOSIS — Z510 Encounter for antineoplastic radiation therapy: Secondary | ICD-10-CM | POA: Diagnosis not present

## 2016-12-06 ENCOUNTER — Ambulatory Visit
Admission: RE | Admit: 2016-12-06 | Discharge: 2016-12-06 | Disposition: A | Discharge status: Home / Self Care | Payer: Medicare Other | Source: Ambulatory Visit | Attending: Radiation Oncology | Admitting: Radiation Oncology

## 2016-12-06 DIAGNOSIS — C3411 Malignant neoplasm of upper lobe, right bronchus or lung: Secondary | ICD-10-CM

## 2016-12-06 DIAGNOSIS — Z510 Encounter for antineoplastic radiation therapy: Secondary | ICD-10-CM | POA: Diagnosis not present

## 2016-12-06 NOTE — Progress Notes (Signed)
  Radiation Oncology         [phone] ________________________________  Name: [name] MRN: [id number]  Date: 12/06/2016  DOB: [birth year]  Stereotactic Body Radiotherapy Treatment Procedure Note  NARRATIVE:  [name] was brought to the stereotactic radiation treatment machine and placed supine on the CT couch. The patient was set up for stereotactic body radiotherapy on the body fix pillow.  3D TREATMENT PLANNING AND DOSIMETRY:  The patient's radiation plan was reviewed and approved prior to starting treatment.  It showed 3-dimensional radiation distributions overlaid onto the planning CT.  The [facility] for the target structures as well as the organs at risk were reviewed. The documentation of this is filed in the radiation oncology EMR.  SIMULATION VERIFICATION:  The patient underwent CT imaging on the treatment unit.  These were carefully aligned to document that the ablative radiation dose would cover the target volume and maximally spare the nearby organs at risk according to the planned distribution.  SPECIAL TREATMENT PROCEDURE: [name] received high dose ablative stereotactic body radiotherapy to the planned target volume without unforeseen complications. Treatment was delivered uneventfully. The high doses associated with stereotactic body radiotherapy and the significant potential risks require careful treatment set up and patient monitoring constituting a special treatment procedure   STEREOTACTIC TREATMENT MANAGEMENT:  Following delivery, the patient was evaluated clinically. The patient tolerated treatment without significant acute effects, and was discharged to home in stable condition.    PLAN: Continue treatment as planned.  ________________________________  Blair Promise, PhD, MD

## 2016-12-07 ENCOUNTER — Ambulatory Visit: Payer: Medicare Other | Admitting: Radiation Oncology

## 2016-12-07 LAB — ACID FAST CULTURE WITH REFLEXED SENSITIVITIES: ACID FAST CULTURE - AFSCU3: NEGATIVE

## 2016-12-08 ENCOUNTER — Ambulatory Visit
Admission: RE | Admit: 2016-12-08 | Discharge: 2016-12-08 | Disposition: A | Discharge status: Home / Self Care | Payer: Medicare Other | Source: Ambulatory Visit | Attending: Radiation Oncology | Admitting: Radiation Oncology

## 2016-12-08 DIAGNOSIS — Z510 Encounter for antineoplastic radiation therapy: Secondary | ICD-10-CM | POA: Diagnosis not present

## 2016-12-11 ENCOUNTER — Ambulatory Visit: Payer: Medicare Other | Admitting: Radiation Oncology

## 2016-12-11 ENCOUNTER — Ambulatory Visit
Admission: RE | Admit: 2016-12-11 | Discharge: 2016-12-11 | Disposition: A | Discharge status: Home / Self Care | Payer: Medicare Other | Source: Ambulatory Visit | Attending: Radiation Oncology | Admitting: Radiation Oncology

## 2016-12-11 VITALS — BP 250/81 | HR 77

## 2016-12-11 DIAGNOSIS — Z510 Encounter for antineoplastic radiation therapy: Secondary | ICD-10-CM | POA: Diagnosis not present

## 2016-12-11 DIAGNOSIS — C3411 Malignant neoplasm of upper lobe, right bronchus or lung: Secondary | ICD-10-CM

## 2016-12-11 NOTE — Progress Notes (Signed)
  Radiation Oncology         [phone] ________________________________  Name: [name] MRN: [id number]  Date: 12/11/2016  DOB: March 31, 1929  Stereotactic Body Radiotherapy Treatment Procedure Note  NARRATIVE:  [name] was brought to the stereotactic radiation treatment machine and placed supine on the CT couch. The patient was set up for stereotactic body radiotherapy on the body fix pillow.  3D TREATMENT PLANNING AND DOSIMETRY:  The patient's radiation plan was reviewed and approved prior to starting treatment.  It showed 3-dimensional radiation distributions overlaid onto the planning CT.  The [facility] for the target structures as well as the organs at risk were reviewed. The documentation of this is filed in the radiation oncology EMR.  SIMULATION VERIFICATION:  The patient underwent CT imaging on the treatment unit.  These were carefully aligned to document that the ablative radiation dose would cover the target volume and maximally spare the nearby organs at risk according to the planned distribution.  SPECIAL TREATMENT PROCEDURE: [name] received high dose ablative stereotactic body radiotherapy to the planned target volume without unforeseen complications. Treatment was delivered uneventfully. The high doses associated with stereotactic body radiotherapy and the significant potential risks require careful treatment set up and patient monitoring constituting a special treatment procedure   STEREOTACTIC TREATMENT MANAGEMENT:  Following delivery, the patient was evaluated clinically. The patient tolerated treatment without significant acute effects, and was discharged to home in stable condition.    PLAN: Continue treatment as planned.  ________________________________  Blair Promise, PhD, MD

## 2016-12-12 ENCOUNTER — Telehealth: Payer: Self-pay | Admitting: Oncology

## 2016-12-12 ENCOUNTER — Ambulatory Visit: Payer: Medicare Other | Admitting: Radiation Oncology

## 2016-12-12 NOTE — Telephone Encounter (Signed)
Called patient back and advised her that Dr. Sondra Come does not think she needs Carafate at this time for the lump in her throat.  Let her know to call back if it gets worse.  She verbalized understanding.

## 2016-12-12 NOTE — Telephone Encounter (Addendum)
[name] called back and asked if radiation could effect her appetite.  She feels like she has a lump in her throat at times.  Advised her that radiation can sometimes irritate the esophagus and that I will check with Dr. Sondra Come to see if she needs a prescription for Carafate.  She said she uses [employer].  She also said her skin over the treatment area feels dry but is not red.  She is wondering if she can apply vitamin E cream to the area.  Advised her that she can use vitamin E cream.

## 2016-12-12 NOTE — Telephone Encounter (Signed)
[name] called and said [name]'s blood pressure was 172/71 at 6:15 last night, 184/78 at 10:15 and 173/74 this morning.  She also has some questions about radiation that she forgot to ask about yesterday.  Attempted to call [name] and Jerlean back.  Left messages for return calls.

## 2016-12-13 ENCOUNTER — Encounter: Payer: Self-pay | Admitting: Radiation Oncology

## 2016-12-13 NOTE — Progress Notes (Signed)
  Radiation Oncology         [phone] ________________________________  Name: [name] MRN: [id number]  Date: 12/13/2016  DOB: 1930/01/25  End of Treatment Note  Diagnosis:  Clinical stage I,  Adenocarcinoma of the upper lobe of the right lung.  Indication for treatment:  Curative        Radiation treatment dates:   11.01.18-11.12.18  Site/dose:   Right Lung, 50Gy total delivered in 5 fractions   Beams/energy:  SBRT/SRT-3D, 6X-FFF  Narrative: The patient tolerated radiation treatment relatively well.  She developed some fatigue, dry cough, and occasional chest tightness throughout her course of treatment, however, these were all manageable. She otherwise tolerated her full course of radiotherapy without acute complaints.   Plan: The patient has completed radiation treatment. The patient will return to radiation oncology clinic for routine followup in one month. I advised them to call or return sooner if they have any questions or concerns related to their recovery or treatment.  -----------------------------------  Blair Promise, PhD, MD  This document serves as a record of services personally performed by Paige Pray, MD. It was created on his behalf by Reola Mosher, a trained medical scribe. The creation of this record is based on the scribe's personal observations and the provider's statements to them. This document has been checked and approved by the attending provider.

## 2016-12-29 ENCOUNTER — Telehealth: Payer: Self-pay | Admitting: Oncology

## 2016-12-29 NOTE — Telephone Encounter (Signed)
Message left by [name] that patient has a scaly red rash on her shoulder and back and is wondering what to do about it.  Called patient and she said the rash appeared about 2 days ago.  She said it is very itchy.  Advised her to try vitamin E lotion and hydrocortisone 1% cream.  She denies having any swelling in the area or any trouble breathing.  Offered her an appointment on Monday to see Dr. Sondra Come.  She said she would like to wait and see if the creams help and will call back if needed.

## 2017-01-12 ENCOUNTER — Encounter: Payer: Self-pay | Admitting: Oncology

## 2017-01-15 ENCOUNTER — Ambulatory Visit
Admission: RE | Admit: 2017-01-15 | Discharge: 2017-01-15 | Disposition: A | Discharge status: Home / Self Care | Payer: Medicare Other | Source: Ambulatory Visit | Attending: Radiation Oncology | Admitting: Radiation Oncology

## 2017-01-15 ENCOUNTER — Encounter: Payer: Self-pay | Admitting: Radiation Oncology

## 2017-01-15 VITALS — BP 216/90 | HR 102 | Temp 98.2°F | Resp 20 | Wt 101.2 lb

## 2017-01-15 DIAGNOSIS — Z888 Allergy status to other drugs, medicaments and biological substances status: Secondary | ICD-10-CM | POA: Diagnosis not present

## 2017-01-15 DIAGNOSIS — Z7982 Long term (current) use of aspirin: Secondary | ICD-10-CM | POA: Diagnosis not present

## 2017-01-15 DIAGNOSIS — Z79899 Other long term (current) drug therapy: Secondary | ICD-10-CM | POA: Diagnosis not present

## 2017-01-15 DIAGNOSIS — Z923 Personal history of irradiation: Secondary | ICD-10-CM | POA: Diagnosis not present

## 2017-01-15 DIAGNOSIS — C3411 Malignant neoplasm of upper lobe, right bronchus or lung: Secondary | ICD-10-CM

## 2017-01-15 DIAGNOSIS — Z882 Allergy status to sulfonamides status: Secondary | ICD-10-CM | POA: Diagnosis not present

## 2017-01-15 DIAGNOSIS — C3410 Malignant neoplasm of upper lobe, unspecified bronchus or lung: Secondary | ICD-10-CM | POA: Insufficient documentation

## 2017-01-15 NOTE — Progress Notes (Addendum)
Patient here follow up RUL radiation 11/30/16-12/11/16, appetite good, no difficulty swallowing, no coughing, nausea or pain,  B/p I s high, 3:32 PM BP (!) 216/90 Comment: lfa  Pulse (!) 102   Temp 98.2 F (36.8 C) (Oral)   Resp 20   Wt 101 lb 3.2 oz (45.9 kg)   BMI 19.76 kg/m   Wt Readings from Last 3 Encounters:  01/15/17 101 lb 3.2 oz (45.9 kg)  11/22/16 102 lb 3.2 oz (46.4 kg)  11/09/16 101 lb 9.6 oz (46.1 kg)

## 2017-01-15 NOTE — Progress Notes (Signed)
Radiation Oncology         [phone]) [phone] ________________________________  Name: [name] MRN: [id number]  Date: 01/15/2017  DOB: 06-Jun-1929  Follow-Up Visit Note  CC: Penni Bombard, PA  [name], *    ICD-10-CM   1. Primary cancer of right upper lobe of lung (HCC) C34.11     Diagnosis:  Clinicalstage I,  Adenocarcinomaof theupper lobe of the right lung.  Indication for treatment:  Curative        Radiation treatment dates:   11.01.18-11.12.18  Site/dose:   Right Lung, 50Gy total delivered in 5 fractions   Beams/energy:  SBRT/SRT-3D, 6X-FFF  Interval Since Last Radiation:  1 months  Narrative:  The patient returns today for routine follow-up.  She denies any  Fatigue, cough, shortness of breath or pain within the chest area                              ALLERGIES:  is allergic to propoxyphene n-acetaminophen; sulfonamide derivatives; and sulfur.  Meds: Current Outpatient Medications  Medication Sig Dispense Refill  . acetaminophen (TYLENOL) 500 MG tablet Take 500 mg by mouth every 8 (eight) hours as needed for mild pain or headache.    Marland Kitchen amLODipine (NORVASC) 10 MG tablet Take 10 mg by mouth daily.    Marland Kitchen aspirin EC 81 MG tablet Take 81 mg by mouth daily.    Marland Kitchen atenolol (TENORMIN) 25 MG tablet Take 25 mg by mouth daily.     Marland Kitchen CRANBERRY PO Take 4,200 mg by mouth daily.     Marland Kitchen denosumab (PROLIA) 60 MG/ML SOLN injection Inject 60 mg into the skin every 6 (six) months. Administer in upper arm, thigh, or abdomen  Next dose is 04-2017    . losartan (COZAAR) 50 MG tablet Take 50 mg by mouth daily.     Marland Kitchen omeprazole (PRILOSEC) 40 MG capsule Take 40 mg by mouth daily before breakfast.     . pravastatin (PRAVACHOL) 20 MG tablet Take 20 mg by mouth at bedtime.     . pyridOXINE (VITAMIN B-6) 100 MG tablet Take 100 mg by mouth daily.    . vitamin E 400 UNIT capsule Take 400 Units by mouth daily.      . sertraline (ZOLOFT) 50 MG tablet Take 25 mg by mouth 2  (two) times daily.      No current facility-administered medications for this encounter.     Physical Findings: The patient is in no acute distress. Patient is alert and oriented.  weight is 101 lb 3.2 oz (45.9 kg). Her oral temperature is 98.2 F (36.8 C). Her blood pressure is 216/90 (abnormal) and her pulse is 102 (abnormal). Her respiration is 20. .  Lungs are clear to auscultation bilaterally. Heart has regular rate and rhythm. No palpable cervical, supraclavicular, or axillary adenopathy. Abdomen soft, non-tender, normal bowel sounds. Lab Findings: Lab Results  Component Value Date   WBC 6.1 10/25/2016   HGB 12.2 10/25/2016   HCT 35.5 (L) 10/25/2016   MCV 87.4 10/25/2016   PLT 183 10/25/2016    Radiographic Findings: No results found.  Impression:  Doing well after SBRT. No residual side effects appreciated. Patient continues to have "white coat syndrome" concerning her blood pressure. She she checks this frequently at home and readings are much lower.  Plan:  Routine follow-up in 3 months. At that time the patient will be scheduled for a chest CT scan  to assess her response to radiation therapy.  ____________________________________ Gery Pray, MD

## 2017-02-06 ENCOUNTER — Encounter: Payer: Self-pay | Admitting: *Deleted

## 2017-02-06 NOTE — Progress Notes (Signed)
On 02-05-17 fax medical records to [facility], it was consult note, end of tx note, sim & planning note, follow up note.

## 2017-02-26 ENCOUNTER — Telehealth: Payer: Self-pay | Admitting: Oncology

## 2017-02-26 NOTE — Telephone Encounter (Signed)
  Katharine Look, patient's friend, called and said [name] has developed swelling in her right lower leg and also had a fever with congestion and cough that started on Thrusday.  She saw her primary care doctor, Dr. Kenton Kingfisher, on Friday who prescribed  Amoxicillin 875 mg bid for 10 days and a lasix for the swelling.  [name] is coughing a lot today and said her temperature is back to normal.  She is asking if the cough could be pneumonitis caused by radiation.  Advised her that it sounds more like an upper respiratory infection and that Dr. Sondra Come will be notified.

## 2017-04-16 ENCOUNTER — Ambulatory Visit
Admission: RE | Admit: 2017-04-16 | Discharge: 2017-04-16 | Disposition: A | Discharge status: Home / Self Care | Payer: Medicare Other | Source: Ambulatory Visit | Attending: Radiation Oncology | Admitting: Radiation Oncology

## 2017-04-16 ENCOUNTER — Other Ambulatory Visit: Payer: Self-pay

## 2017-04-16 ENCOUNTER — Encounter: Payer: Self-pay | Admitting: Radiation Oncology

## 2017-04-16 DIAGNOSIS — Z7982 Long term (current) use of aspirin: Secondary | ICD-10-CM | POA: Insufficient documentation

## 2017-04-16 DIAGNOSIS — R05 Cough: Secondary | ICD-10-CM | POA: Diagnosis not present

## 2017-04-16 DIAGNOSIS — C3411 Malignant neoplasm of upper lobe, right bronchus or lung: Secondary | ICD-10-CM | POA: Diagnosis present

## 2017-04-16 DIAGNOSIS — Z79899 Other long term (current) drug therapy: Secondary | ICD-10-CM | POA: Diagnosis not present

## 2017-04-16 DIAGNOSIS — Z882 Allergy status to sulfonamides status: Secondary | ICD-10-CM | POA: Diagnosis not present

## 2017-04-16 NOTE — Progress Notes (Signed)
Radiation Oncology         [phone] ________________________________  Name: [name] MRN: [id number]  Date: 04/16/2017  DOB: 24-Jun-1929  Follow-Up Visit Note  CC: Shirline Frees, MD  [name], *    ICD-10-CM   1. Primary cancer of right upper lobe of lung (HCC) C34.11 CT Chest Wo Contrast    Diagnosis:  Clinicalstage I,  Adenocarcinomaof theupper lobe of the right lung.  Indication for treatment:  Curative        Radiation treatment dates:   11.01.18-11.12.18  Site/dose:   Right Lung, 50Gy total delivered in 5 fractions   Beams/energy:  SBRT/SRT-3D, 6X-FFF  Interval Since Last Radiation:  4 months  Narrative:  The patient returns today for routine follow-up.  She reports having a respiratory infection in January. This was associated with fever, chills, coughing, and bilateral ankle swelling. She was treated with Amoxycillin and fluid pills. She reports a dry cough in the morning from allergies. She denies any pain or fatigue.                      ALLERGIES:  is allergic to propoxyphene n-acetaminophen; sulfonamide derivatives; and sulfur.  Meds: Current Outpatient Medications  Medication Sig Dispense Refill  . acetaminophen (TYLENOL) 500 MG tablet Take 500 mg by mouth every 8 (eight) hours as needed for mild pain or headache.    Marland Kitchen amLODipine (NORVASC) 10 MG tablet Take 10 mg by mouth daily.    Marland Kitchen aspirin EC 81 MG tablet Take 81 mg by mouth daily.    Marland Kitchen atenolol (TENORMIN) 25 MG tablet Take 25 mg by mouth daily.     Marland Kitchen CRANBERRY PO Take 4,200 mg by mouth daily.     Marland Kitchen denosumab (PROLIA) 60 MG/ML SOLN injection Inject 60 mg into the skin every 6 (six) months. Administer in upper arm, thigh, or abdomen  Next dose is 04-2017    . fluticasone (FLONASE) 50 MCG/ACT nasal spray fluticasone propionate 50 mcg/actuation nasal spray,suspension    . losartan (COZAAR) 50 MG tablet Take 50 mg by mouth daily.     Marland Kitchen omeprazole (PRILOSEC) 40 MG capsule Take 40 mg  by mouth daily before breakfast.     . pravastatin (PRAVACHOL) 20 MG tablet Take 20 mg by mouth at bedtime.     . pyridOXINE (VITAMIN B-6) 100 MG tablet Take 100 mg by mouth daily.    . sertraline (ZOLOFT) 50 MG tablet Take 25 mg by mouth 2 (two) times daily.     . vitamin E 400 UNIT capsule Take 400 Units by mouth daily.      . furosemide (LASIX) 20 MG tablet furosemide 20 mg tablet     No current facility-administered medications for this encounter.     Physical Findings: The patient is in no acute distress. Patient is alert and oriented.  height is 5' (1.524 m) and weight is 97 lb 12.8 oz (44.4 kg). Her oral temperature is 98.1 F (36.7 C). Her blood pressure is 163/59 (abnormal) and her pulse is 79. Her oxygen saturation is 98%. .  Lungs are clear to auscultation bilaterally. Heart has regular rate and rhythm. No palpable cervical, supraclavicular, or axillary adenopathy. Abdomen soft, non-tender, normal bowel sounds.  Lab Findings: Lab Results  Component Value Date   WBC 6.1 10/25/2016   HGB 12.2 10/25/2016   HCT 35.5 (L) 10/25/2016   MCV 87.4 10/25/2016   PLT 183 10/25/2016    Radiographic Findings: No  results found.  Impression:  Doing well after SBRT. Clinically no evidence of recurrence.  Plan:  Routine follow-up in 6 months. Order chest CT scan for later this week.   ____________________________________  This document serves as a record of services personally performed by Gery Pray, MD. It was created on his behalf by Bethann Humble, a trained medical scribe. The creation of this record is based on the scribe's personal observations and the provider's statements to them. This document has been checked and approved by the attending provider.

## 2017-04-16 NOTE — Progress Notes (Signed)
[name] is here for follow up.  She denies having any pain.  She reports having a respiration infection in January which was cleared up by antibiotics.  She reports having a dry cough in the morning from allergies.  She denies having any fatigue.  BP (!) 163/59 (BP Location: Left Wrist, Patient Position: Sitting)   Pulse 79   Temp 98.1 F (36.7 C) (Oral)   Ht 5' (1.524 m)   Wt 97 lb 12.8 oz (44.4 kg)   SpO2 98%   BMI 19.10 kg/m    Wt Readings from Last 3 Encounters:  04/16/17 97 lb 12.8 oz (44.4 kg)  01/15/17 101 lb 3.2 oz (45.9 kg)  11/22/16 102 lb 3.2 oz (46.4 kg)

## 2017-05-04 ENCOUNTER — Ambulatory Visit (HOSPITAL_COMMUNITY)
Admission: RE | Admit: 2017-05-04 | Discharge: 2017-05-04 | Disposition: A | Discharge status: Home / Self Care | Payer: Medicare Other | Source: Ambulatory Visit | Attending: Radiation Oncology | Admitting: Radiation Oncology

## 2017-05-04 DIAGNOSIS — R911 Solitary pulmonary nodule: Secondary | ICD-10-CM | POA: Diagnosis not present

## 2017-05-04 DIAGNOSIS — C3411 Malignant neoplasm of upper lobe, right bronchus or lung: Secondary | ICD-10-CM | POA: Insufficient documentation

## 2017-05-09 ENCOUNTER — Telehealth: Payer: Self-pay | Admitting: Oncology

## 2017-05-09 NOTE — Telephone Encounter (Signed)
Left a message for patient regarding her CT scan results.  Requested a return call.

## 2017-05-09 NOTE — Telephone Encounter (Signed)
Patient called and was advised of her CT Chest results from 05/05/17.  She verbalized understanding and did not have any questions.

## 2017-09-24 ENCOUNTER — Other Ambulatory Visit: Payer: Self-pay | Admitting: Family Medicine

## 2017-09-24 DIAGNOSIS — I6523 Occlusion and stenosis of bilateral carotid arteries: Secondary | ICD-10-CM

## 2017-10-04 ENCOUNTER — Ambulatory Visit
Admission: RE | Admit: 2017-10-04 | Discharge: 2017-10-04 | Disposition: A | Discharge status: Home / Self Care | Payer: Medicare Other | Source: Ambulatory Visit | Attending: Family Medicine | Admitting: Family Medicine

## 2017-10-04 DIAGNOSIS — I6523 Occlusion and stenosis of bilateral carotid arteries: Secondary | ICD-10-CM

## 2017-10-12 ENCOUNTER — Other Ambulatory Visit: Payer: Self-pay

## 2017-10-12 DIAGNOSIS — I6522 Occlusion and stenosis of left carotid artery: Secondary | ICD-10-CM

## 2017-10-18 ENCOUNTER — Encounter: Payer: Self-pay | Admitting: Radiation Oncology

## 2017-10-18 ENCOUNTER — Ambulatory Visit
Admission: RE | Admit: 2017-10-18 | Discharge: 2017-10-18 | Disposition: A | Discharge status: Home / Self Care | Payer: Medicare Other | Source: Ambulatory Visit | Attending: Radiation Oncology | Admitting: Radiation Oncology

## 2017-10-18 ENCOUNTER — Other Ambulatory Visit: Payer: Self-pay

## 2017-10-18 VITALS — BP 182/85 | HR 94 | Temp 98.4°F | Resp 18 | Ht 60.0 in | Wt 97.4 lb

## 2017-10-18 DIAGNOSIS — Z882 Allergy status to sulfonamides status: Secondary | ICD-10-CM | POA: Diagnosis not present

## 2017-10-18 DIAGNOSIS — C3411 Malignant neoplasm of upper lobe, right bronchus or lung: Secondary | ICD-10-CM | POA: Diagnosis present

## 2017-10-18 DIAGNOSIS — Z79899 Other long term (current) drug therapy: Secondary | ICD-10-CM | POA: Insufficient documentation

## 2017-10-18 NOTE — Progress Notes (Signed)
Radiation Oncology         [phone] ________________________________  Name: [name] MRN: [id number]  Date: 10/18/2017  DOB: 1929-06-09  Follow-Up Visit Note  CC: Shirline Frees, MD  [name], *    ICD-10-CM   1. 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 She is accompanied by her niece. She is doing well overall.  She will travel to the [place] next year.     ALLERGIES:  is allergic to propoxyphene n-acetaminophen; sulfonamide derivatives; and sulfur.  Meds: Current Outpatient Medications  Medication Sig Dispense Refill  . acetaminophen (TYLENOL) 500 MG tablet Take 500 mg by mouth every 8 (eight) hours as needed for mild pain or headache.    Marland Kitchen amLODipine (NORVASC) 10 MG tablet Take 10 mg by mouth daily.    Marland Kitchen aspirin EC 81 MG tablet Take 81 mg by mouth daily.    Marland Kitchen atenolol (TENORMIN) 25 MG tablet Take 25 mg by mouth daily.     Marland Kitchen CRANBERRY PO Take 4,200 mg by mouth daily.     Marland Kitchen denosumab (PROLIA) 60 MG/ML SOLN injection Inject 60 mg into the skin every 6 (six) months. Administer in upper arm, thigh, or abdomen  Next dose is 04-2017    . fluticasone (FLONASE) 50 MCG/ACT nasal spray fluticasone propionate 50 mcg/actuation nasal spray,suspension    . furosemide (LASIX) 20 MG tablet furosemide 20 mg tablet    . losartan (COZAAR) 50 MG tablet Take 50 mg by mouth daily.     Marland Kitchen omeprazole (PRILOSEC) 40 MG capsule Take 40 mg by mouth daily before breakfast.     . pravastatin (PRAVACHOL) 20 MG tablet Take 20 mg by mouth at bedtime.     . pyridOXINE (VITAMIN B-6) 100 MG tablet Take 100 mg by mouth daily.    . sertraline (ZOLOFT) 50 MG tablet Take 25 mg by  mouth 2 (two) times daily.     . vitamin E 400 UNIT capsule Take 400 Units by mouth daily.       No current facility-administered medications for this encounter.    Review of Systems:  A 10+ POINT REVIEW OF SYSTEMS WAS OBTAINED including neurology, dermatology, psychiatry, cardiac, respiratory, lymph, extremities, GI, GU, musculoskeletal, constitutional, reproductive, HEENT. She reports occasional fatigue, occasional dry cough, and mild shortness of breath on exertion. She denies any pain, hemoptysis, painful or difficulty swallowing, and redness on her chest or back. She denies any new back pain or headaches  Physical Findings: The patient is in no acute distress. Patient is alert and oriented.  height is 5' (1.524 m) and weight is 97 lb 6.4 oz (44.2 kg). Her oral temperature is 98.4 F (36.9 C). Her blood pressure is 182/85 (abnormal) and her pulse is 94. Her respiration is 18 and oxygen saturation is 99%. .  Lungs are clear to auscultation bilaterally. Heart has regular rate and rhythm. No palpable cervical, supraclavicular, or axillary adenopathy. Abdomen soft, non-tender, normal bowel sounds.  Lab Findings: Lab Results  Component Value Date   WBC 6.1 10/25/2016   HGB 12.2 10/25/2016   HCT 35.5 (L) 10/25/2016   MCV 87.4 10/25/2016   PLT 183 10/25/2016  Radiographic Findings: US Carotid Bilateral  Result Date: 10/04/2017 CLINICAL DATA:  Bilateral carotid artery stenosis EXAM: BILATERAL CAROTID DUPLEX ULTRASOUND TECHNIQUE: Pearline Cables scale imaging, color Doppler and duplex ultrasound were performed of bilateral carotid and vertebral arteries in the neck. COMPARISON:  None. FINDINGS: Criteria: Quantification of carotid stenosis is based on velocity parameters that correlate the residual internal carotid diameter with NASCET-based stenosis levels, using the diameter of the distal internal carotid lumen as the denominator for stenosis measurement. The following velocity measurements were  obtained: RIGHT ICA: 236 cm/sec CCA: 898 cm/sec SYSTOLIC ICA/CCA RATIO:  1.5 ECA: 204 cm/sec LEFT ICA: 477 cm/sec CCA: 421 cm/sec SYSTOLIC ICA/CCA RATIO:  3.5 ECA: 345 cm/sec RIGHT CAROTID ARTERY: Moderate smooth mixed plaque in the bulb. Low resistance internal carotid Doppler pattern. RIGHT VERTEBRAL ARTERY:  Antegrade. LEFT CAROTID ARTERY: Advanced irregular calcified plaque in the bulb. Low resistance internal carotid Doppler pattern is preserved. LEFT VERTEBRAL ARTERY:  Antegrade. Upper extremity blood pressures: RIGHT: 202/100 LEFT: 210/92 IMPRESSION: 50-69% stenosis in the right internal carotid artery. Greater than 70% stenosis in the left internal carotid artery. Electronically Signed   By: Marybelle Killings M.D.   On: 10/04/2017 14:16    Impression:  Clinicalstage I Adenocarcinomaof theupper lobe of the right lung status post SBRT.  No evidence of recurrence on clinical exam.   Plan:  Chest CT scan scheduled for early October. Routine follow-up in 6 months.  -----------------------------------  Blair Promise, PhD, MD  This document serves as a record of services personally performed by Gery Pray, MD. It was created on his behalf by Wilburn Mylar, a trained medical scribe. The creation of this record is based on the scribe's personal observations and the provider's statements to them. This document has been checked and approved by the attending provider.

## 2017-10-18 NOTE — Progress Notes (Signed)
Pt here today for a follow-up for right lung cancer. Pt denies having any pain. Pt states occasional fatigue. Pt states having a dry cough occasionally. Pt denies having hemoptysis. Pt states mild shortness of breath on exertion. Pt denies having any painful or difficulty swallowing. Pt denies having redness on her chest or back. Pt states that she is using Cetaphil on her skin. Pt states that she is not on chemotherapy treatments.  BP (!) 182/85 (BP Location: Right Arm, Patient Position: Sitting)   Pulse 94   Temp 98.4 F (36.9 C) (Oral)   Resp 18   Ht 5' (1.524 m)   Wt 97 lb 6.4 oz (44.2 kg)   SpO2 99%   BMI 19.02 kg/m    Wt Readings from Last 3 Encounters:  10/18/17 97 lb 6.4 oz (44.2 kg)  04/16/17 97 lb 12.8 oz (44.4 kg)  01/15/17 101 lb 3.2 oz (45.9 kg)

## 2017-10-23 IMAGING — MR MR HEAD WO/W CM
9 of 13 series · 35 of 48 positions shown · IV contrast (multihance)
Comparison: CTA neck [REDACTED]. MRI of the cervical spine 07/25/2007

CLINICAL DATA: Non-small cell lung cancer.

EXAM:
MRI HEAD WITHOUT AND WITH CONTRAST
TECHNIQUE: Multiplanar, multiecho pulse sequences of the brain and surrounding
structures were obtained without and with intravenous contrast.
CONTRAST:  9 mL MultiHance

[Series 4: DWI · axial · 3.0mm · 1.09mm/px · z∈[-90,+39]mm · 9 of 88 slices shown (1 of 4)]
[im 1/88]
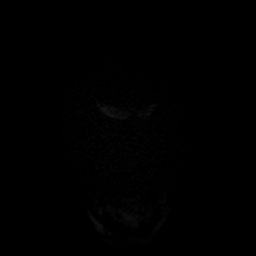
[im 11/88]
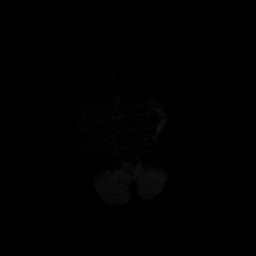
[im 22/88]
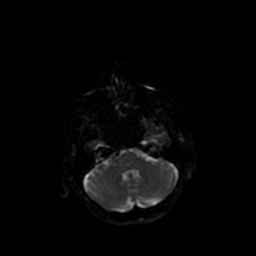
[im 33/88]
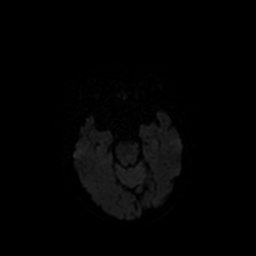
[im 44/88]
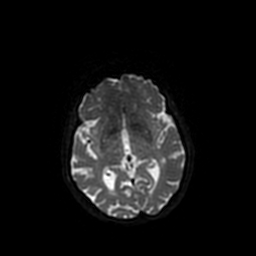
[im 55/88]
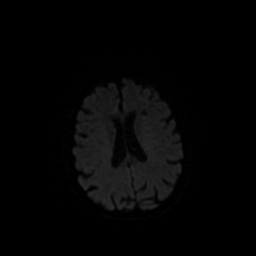
[im 66/88]
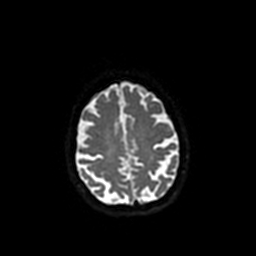
[im 77/88]
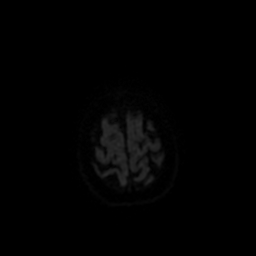
[im 88/88]
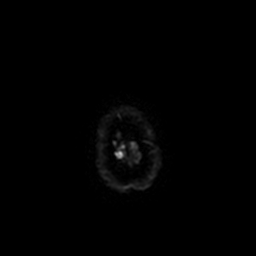

[Series 5: DWI · coronal · 5.0mm · 1.09mm/px · 7 of 68 slices shown (2 of 4)]
[im 1/68]
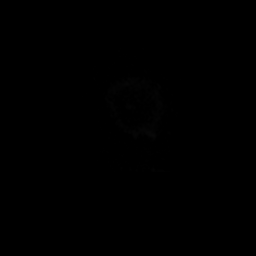
[im 12/68]
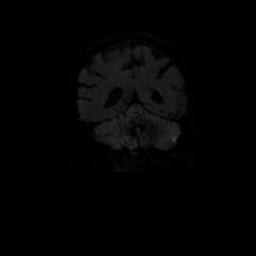
[im 23/68]
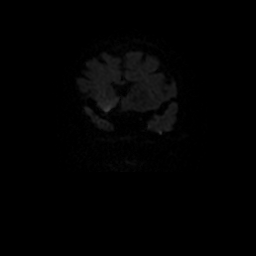
[im 34/68]
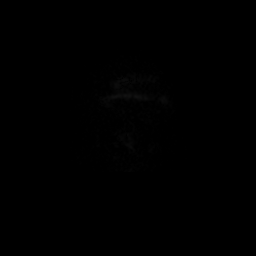
[im 45/68]
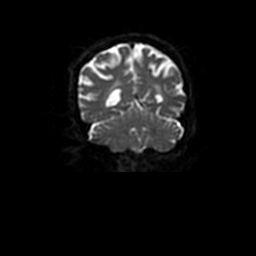
[im 56/68]
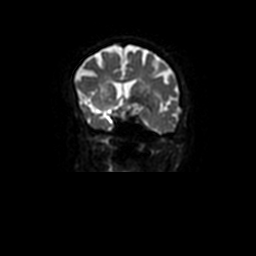
[im 68/68]
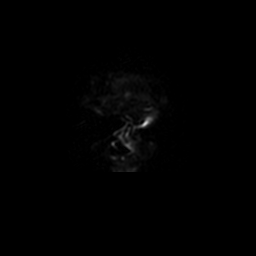

[Series 6: T2 · axial · 5.0mm · 0.43mm/px · z∈[-91,+42]mm · 2 of 20 slices shown]
[im 1/20]
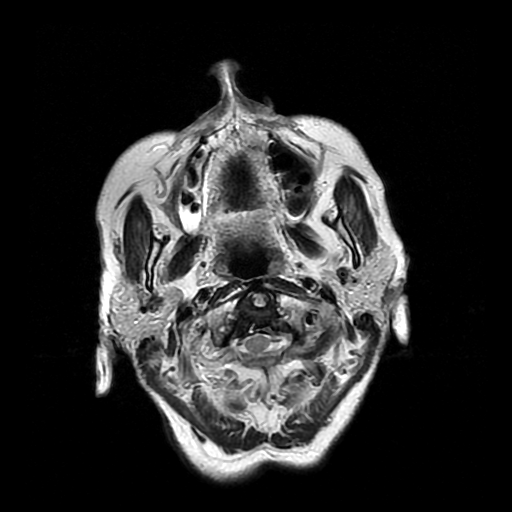
[im 20/20]
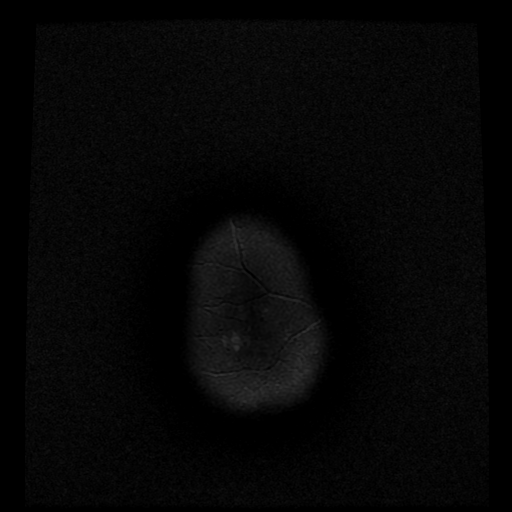

[Series 7: FLAIR · axial · 5.0mm · 0.43mm/px · z∈[-91,+42]mm · 2 of 20 slices shown]
[im 1/20]
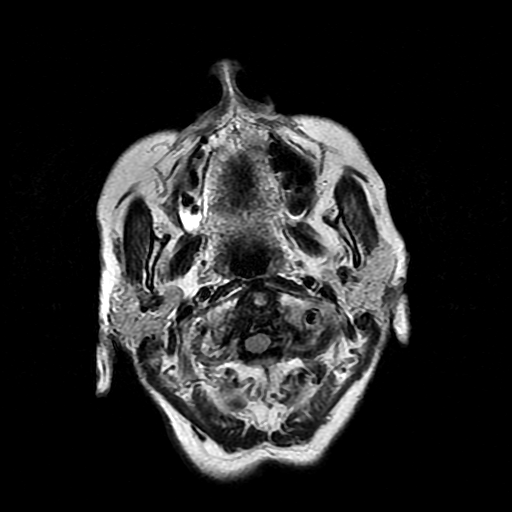
[im 20/20]
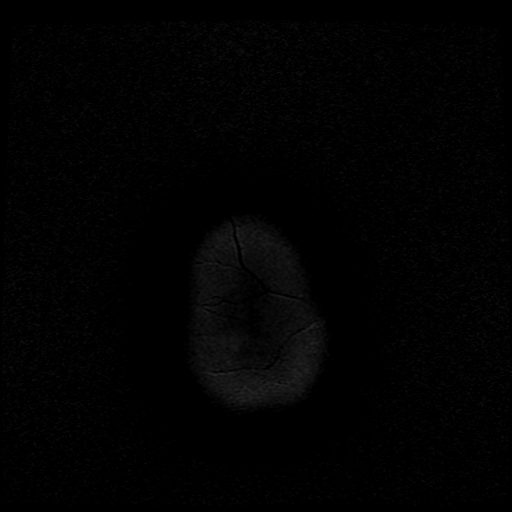

[Series 10: T2 post-contrast · coronal · 5.0mm · 0.45mm/px · 3 of 27 slices shown]
[im 1/27]
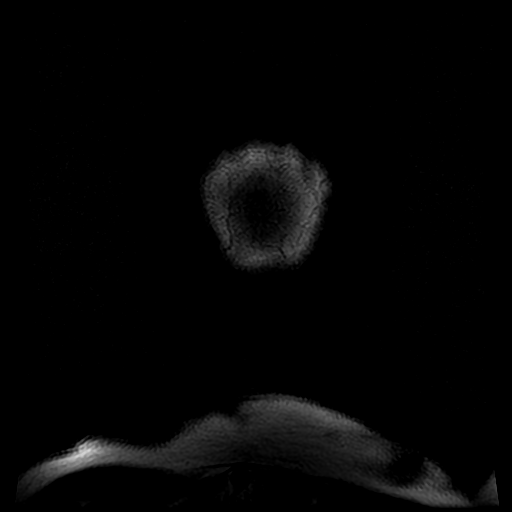
[im 14/27]
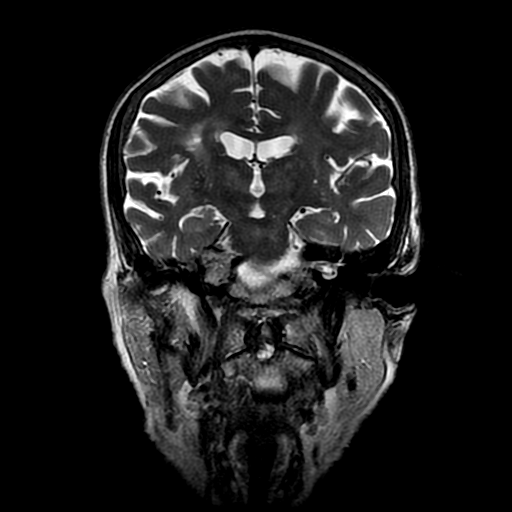
[im 27/27]
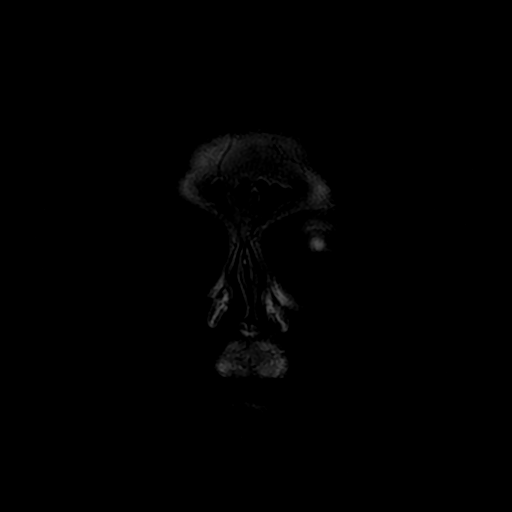

[Series 12: T1 post-contrast · coronal · 5.0mm · 0.45mm/px · 3 of 27 slices shown (1 of 2)]
[im 1/27]
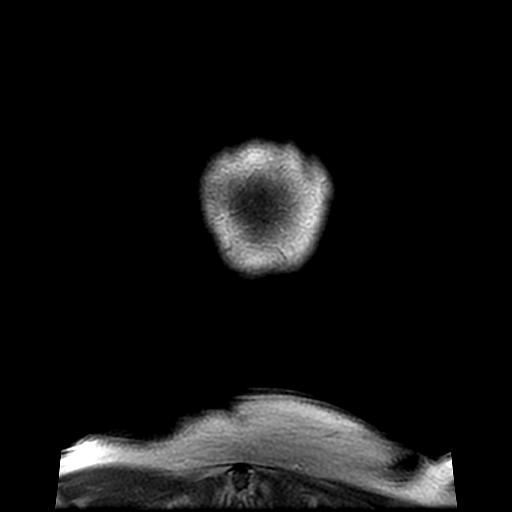
[im 14/27]
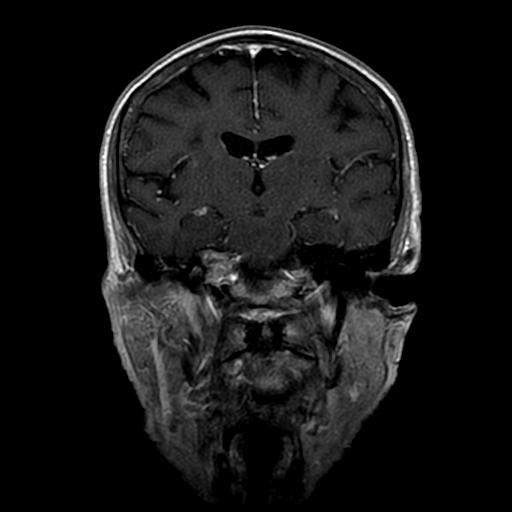
[im 27/27]
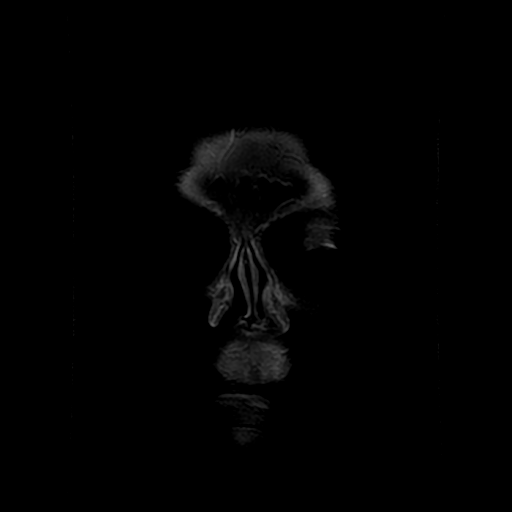

[Series 13: T1 post-contrast · sagittal · 5.0mm · 0.47mm/px · 2 of 22 slices shown (2 of 2)]
[im 1/22]
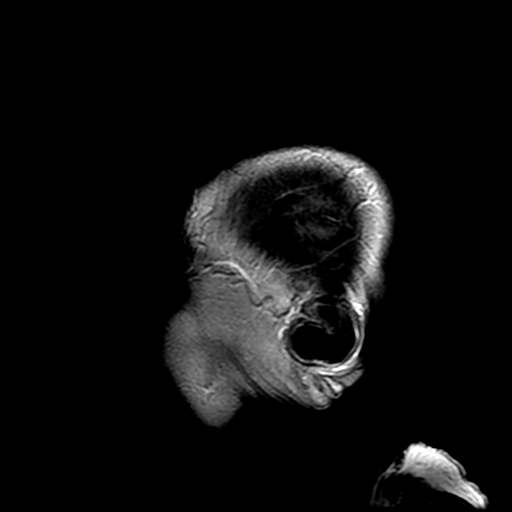
[im 22/22]
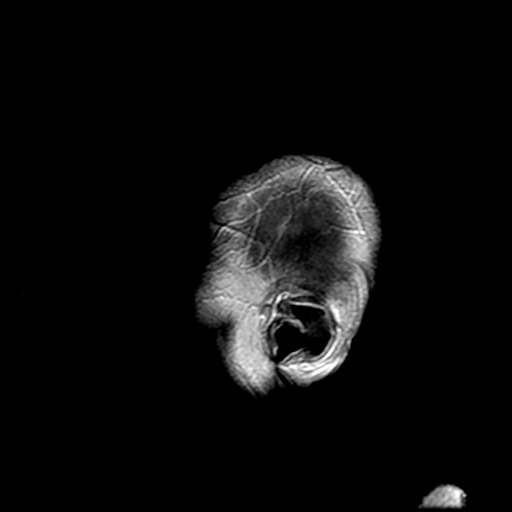

[Series 400: DWI · axial · 3.0mm · 1.09mm/px · z∈[-90,+39]mm · 4 of 44 slices shown (3 of 4)]
[im 1/44]
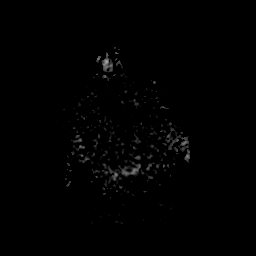
[im 15/44]
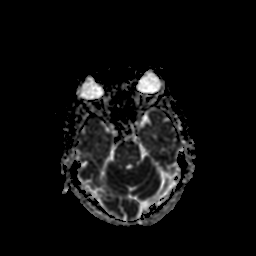
[im 29/44]
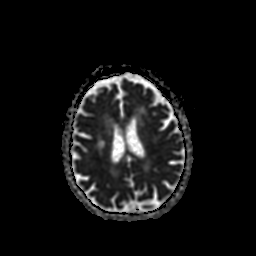
[im 44/44]
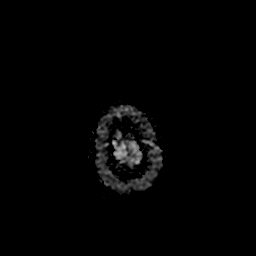

[Series 500: DWI · coronal · 5.0mm · 1.09mm/px · 3 of 34 slices shown (4 of 4)]
[im 1/34]
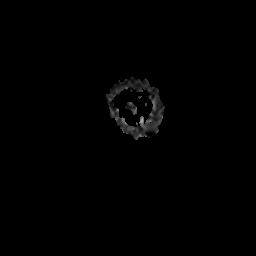
[im 17/34]
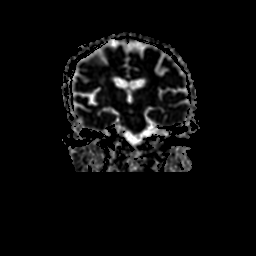
[im 34/34]
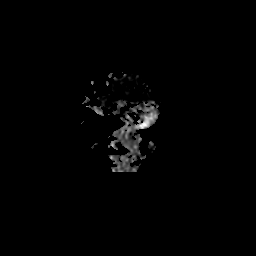

[35 of 48 positions shown; findings below may reference images not displayed]

FINDINGS: Brain: No acute infarct, hemorrhage, or mass lesion is present. The
ventricles are of normal size. Moderate diffuse white matter changes
are present bilaterally. There are remote lacunar infarcts involving
the corona radiata bilaterally. Dilated perivascular spaces are
present throughout the basal ganglia. White matter changes extend
into the brainstem.

The postcontrast images demonstrate no pathologic enhancement.

Vascular: Flow is present in the major intracranial arteries.

Skull and upper cervical spine: The skullbase is within normal
limits. Extensive soft tissue surrounds the dens and the C1-2
compatible with inflammatory arthritis. There is exaggerated
lordosis of the upper cervical spine without significant stenosis.

Sinuses/Orbits: Minimal fluid is present in the right maxillary
sinus. The remaining paranasal sinuses and the mastoid air cells are
clear. Bilateral lens replacements are present. The globes and
orbits are otherwise unremarkable.
IMPRESSION: 1. No evidence for metastatic disease to the brain or meninges.
2. Mild atrophy and moderate diffuse white matter disease. This
likely reflects the sequela of chronic microvascular ischemia.
3. Remote white matter infarcts involving the corona radiata
bilaterally.
4. Progressive inflammatory arthritis with soft tissue and erosive
changes at C1-2.

## 2017-11-09 ENCOUNTER — Encounter: Payer: Self-pay | Admitting: Vascular Surgery

## 2017-11-09 ENCOUNTER — Ambulatory Visit: Payer: Medicare Other | Admitting: Vascular Surgery

## 2017-11-09 ENCOUNTER — Other Ambulatory Visit: Payer: Self-pay

## 2017-11-09 ENCOUNTER — Ambulatory Visit (HOSPITAL_COMMUNITY)
Admission: RE | Admit: 2017-11-09 | Discharge: 2017-11-09 | Disposition: A | Discharge status: Home / Self Care | Payer: Medicare Other | Source: Ambulatory Visit | Attending: Vascular Surgery | Admitting: Vascular Surgery

## 2017-11-09 VITALS — BP 170/77 | HR 81 | Temp 97.3°F | Resp 16 | Ht 60.0 in | Wt 98.0 lb

## 2017-11-09 DIAGNOSIS — I6522 Occlusion and stenosis of left carotid artery: Secondary | ICD-10-CM | POA: Diagnosis not present

## 2017-11-09 NOTE — Progress Notes (Signed)
Patient ID: [name], female   DOB: 06-Aug-1929, 84 y.o.   MRN: [id number]  Reason for Consult: New Patient (Initial Visit) (Referred by Dr. Shirline Frees.  70% L ICA Stenosis. )   Referred by Shirline Frees, MD  Subjective:     HPI:  [name] is a 84 y.o. female with history of carotid stenosis that was previously followed in [facility]  She denies ever having a history of stroke or TIA or amaurosis.  She continues to stay very active.  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  She does not know her family history as her parents died very young.  She does not have diabetes.  She remains very active does not have any issues with walking with her bilateral lower extremities.  She takes aspirin and a statin drug daily.  Past Medical History:  Diagnosis Date  . Anxiety   . Anxiety disorder   . Arthritis   . Carotid stenosis   . Complication of anesthesia   . COPD (chronic obstructive pulmonary disease) ([place])   . 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 Esophageal stricture   . GERD (gastroesophageal reflux disease)   . History of gastritis   . History of phlebitis   . History of radiation therapy 11/30/16-12/11/16   SBRT to right lung 50 Gy in 5 fractions  . Hypertension   . Lung nodule    Right Upper  . Osteoporosis   . Pneumonia   . PONV (postoperative nausea and vomiting)   . UTI (urinary tract infection)    Family History  Problem Relation Age of Onset  . Breast cancer Mother   . Breast cancer Sister        x 2  . Uterine cancer Unknown   . Breast cancer Sister   . Colon cancer Neg Hx    Past Surgical History:  Procedure Laterality Date  . BREAST CYST EXCISION     left  . CATARACT EXTRACTION     bilateral  . CYSTECTOMY     Left knee  . EYE SURGERY    . KNEE ARTHROSCOPY     right/ Pt can not recall  . VIDEO BRONCHOSCOPY WITH ENDOBRONCHIAL NAVIGATION N/A 10/25/2016   Procedure:  VIDEO BRONCHOSCOPY WITH ENDOBRONCHIAL NAVIGATION with biopsies;  Surgeon: [name], MD;  Location: [facility] OR;  Service: Thoracic;  Laterality: N/A;    Short Social History:  Social History   Tobacco Use  . Smoking status: Passive Smoke Exposure - Never Smoker  . Smokeless tobacco: Never Used  . Tobacco comment: Exposed at work 28 yrs.   Substance Use Topics  . Alcohol use: Yes    Comment: occasional    Allergies  Allergen Reactions  . Propoxyphene N-Acetaminophen Other (See Comments)    Unknown   . Sulfonamide Derivatives Other (See Comments)    headaches  . Sulfur Other (See Comments)    Severe headache and blurry vision    Current Outpatient Medications  Medication Sig Dispense Refill  . amLODipine (NORVASC) 10 MG tablet Take 10 mg by mouth daily.    Marland Kitchen AMLODIPINE BESY-BENAZEPRIL HCL PO Take by mouth.    . Ascorbic Acid (VITAMIN C) 500 MG CAPS Take by mouth.    Marland Kitchen aspirin EC 81 MG tablet Take 81 mg by mouth daily.    Marland Kitchen atenolol (TENORMIN) 25 MG tablet Take 25 mg by mouth daily.     Marland Kitchen  CRANBERRY PO Take 4,200 mg by mouth daily.     Marland Kitchen denosumab (PROLIA) 60 MG/ML SOLN injection Inject 60 mg into the skin every 6 (six) months. Administer in upper arm, thigh, or abdomen  Next dose is 04-2017    . losartan (COZAAR) 50 MG tablet Take 50 mg by mouth daily.     Marland Kitchen omeprazole (PRILOSEC) 40 MG capsule Take 40 mg by mouth daily before breakfast.     . pravastatin (PRAVACHOL) 20 MG tablet Take 20 mg by mouth at bedtime.     . pyridOXINE (VITAMIN B-6) 100 MG tablet Take 100 mg by mouth daily.    . sertraline (ZOLOFT) 50 MG tablet Take 25 mg by mouth 2 (two) times daily.     . vitamin E 400 UNIT capsule Take 400 Units by mouth daily.      Marland Kitchen acetaminophen (TYLENOL) 500 MG tablet Take 500 mg by mouth every 8 (eight) hours as needed for mild pain or headache.    . fluticasone (FLONASE) 50 MCG/ACT nasal spray fluticasone propionate 50 mcg/actuation nasal spray,suspension    . furosemide  (LASIX) 20 MG tablet furosemide 20 mg tablet     No current facility-administered medications for this visit.     Review of Systems  Constitutional:  Constitutional negative. HENT: HENT negative.  Eyes: Eyes negative.  Respiratory: Respiratory negative.  Cardiovascular: Cardiovascular negative.  GI: Gastrointestinal negative.  Skin: Skin negative.  Neurological: Positive for dizziness.  Hematologic: Hematologic/lymphatic negative.  Psychiatric: Psychiatric negative.        Objective:  Objective   Vitals:   11/09/17 0953 11/09/17 0956  BP: (!) 182/68 (!) 170/77  Pulse: 81   Resp: 16   Temp: (!) 97.3 F (36.3 C)   TempSrc: Oral   SpO2: 99%   Weight: 98 lb (44.5 kg)   Height: 5' (1.524 m)    Body mass index is 19.14 kg/m.  Physical Exam  Constitutional: She is oriented to person, place, and time. She appears well-developed.  HENT:  Head: Normocephalic.  Eyes: Pupils are equal, round, and reactive to light.  Neck: Normal range of motion. Neck supple.  Cardiovascular: Normal rate.  Pulses:      Carotid pulses are 2+ on the right side, and 2+ on the left side.      Radial pulses are 2+ on the right side, and 2+ on the left side.       Femoral pulses are 2+ on the right side, and 2+ on the left side.      Popliteal pulses are 0 on the right side.       Dorsalis pedis pulses are 0 on the right side.       Posterior tibial pulses are 0 on the right side, and 2+ on the left side.  Pulmonary/Chest: Effort normal.  Abdominal: Soft.  Musculoskeletal: Normal range of motion. She exhibits no edema.  Neurological: She is alert and oriented to person, place, and time.  Skin: Skin is dry. Capillary refill takes less than 2 seconds.  Psychiatric: She has a normal mood and affect. Her behavior is normal. Judgment and thought content normal.    Data: I have independently interpreted her carotid duplex which demonstrates 40 to 59% stenosis in the left with peak systolic velocity  of 517 and end-diastolic velocity 45 cm/s     Assessment/Plan:     84 year old female presents for evaluation of carotid artery stenosis.  Prior study today she has 40 to 59% stenosis that  is asymptomatic.  From that standpoint she can follow-up in 1 year with repeat carotid duplex.  She will continue aspirin and statin.     Waynetta Sandy MD [facility]

## 2018-04-17 ENCOUNTER — Telehealth: Payer: Self-pay | Admitting: *Deleted

## 2018-04-17 NOTE — Telephone Encounter (Signed)
CALLED PATIENT TO ASK ABOUT WHEN SHE CAN DO TEST AND FU, LVM FOR A RETURN CALL

## 2018-04-18 ENCOUNTER — Ambulatory Visit: Payer: Medicare Other | Admitting: Radiation Oncology

## 2018-04-19 ENCOUNTER — Telehealth: Payer: Self-pay | Admitting: Radiation Oncology

## 2018-04-19 NOTE — Telephone Encounter (Signed)
Pt. Returned Shirley's call from 04/17/18. Pt. Was inquiring about whether or not her FU and procedure could be postponed due to [place]. Sharee Pimple and Dr. Sondra Come already gone for the day. I informed PT. That I would have someone reach out to her on Monday regarding next steps and recommendations. Pt. Asked that we call Randon Goldsmith (friend) @ [phone].

## 2018-04-22 ENCOUNTER — Telehealth: Payer: Self-pay | Admitting: *Deleted

## 2018-04-22 NOTE — Telephone Encounter (Signed)
CALLED PATIENT'S FRIEND - SANDRA ROBBINS TO INFORM OF CT ON 10-16-18 - ARRIVAL TIME- 1:45 PM @ [facility], NO RESTRICTIONS TO TEST, PATIENT TO FU WITH DR. KINARD ON 10-17-18 @ 11:45 AM FOR RESULTS, SPOKE WITH PATIENT'S FRIEND - SANDRA ROBBINS AND SHE IS AWARE OF THESE APPTS.

## 2018-09-30 ENCOUNTER — Telehealth: Payer: Self-pay | Admitting: *Deleted

## 2018-09-30 NOTE — Telephone Encounter (Signed)
CALLED PATIENT TO ALTER FU APPT. FOR 10-17-18 DUE TO DR. KINARD BEING ON VACATION, RESCHEDULED FOR 10-31-18 @ 11 AM, PATIENT AGREED TO NEW DATE AND TIME

## 2018-10-02 ENCOUNTER — Other Ambulatory Visit: Payer: Self-pay | Admitting: Family Medicine

## 2018-10-02 DIAGNOSIS — Z1231 Encounter for screening mammogram for malignant neoplasm of breast: Secondary | ICD-10-CM

## 2018-10-02 DIAGNOSIS — M810 Age-related osteoporosis without current pathological fracture: Secondary | ICD-10-CM

## 2018-10-04 ENCOUNTER — Telehealth: Payer: Self-pay | Admitting: *Deleted

## 2018-10-04 NOTE — Telephone Encounter (Signed)
Called patient to inform that scan has been rescheduled for 10-30-18 - arrival time- 2:15 pm @ [facility], no restrictions to test and her fu will be on 10-31-18 with Dr. Sondra Come @ 11 am , lvm for a return call

## 2018-10-16 ENCOUNTER — Ambulatory Visit (HOSPITAL_COMMUNITY): Admission: RE | Admit: 2018-10-16 | Payer: Medicare Other | Source: Ambulatory Visit

## 2018-10-17 ENCOUNTER — Ambulatory Visit: Payer: Medicare Other | Admitting: Radiation Oncology

## 2018-10-30 ENCOUNTER — Other Ambulatory Visit: Payer: Self-pay

## 2018-10-30 ENCOUNTER — Ambulatory Visit (HOSPITAL_COMMUNITY)
Admission: RE | Admit: 2018-10-30 | Discharge: 2018-10-30 | Disposition: A | Discharge status: Home / Self Care | Payer: Medicare Other | Source: Ambulatory Visit | Attending: Radiation Oncology | Admitting: Radiation Oncology

## 2018-10-30 DIAGNOSIS — C3411 Malignant neoplasm of upper lobe, right bronchus or lung: Secondary | ICD-10-CM | POA: Diagnosis present

## 2018-10-31 ENCOUNTER — Encounter: Payer: Self-pay | Admitting: Radiation Oncology

## 2018-10-31 ENCOUNTER — Other Ambulatory Visit: Payer: Self-pay

## 2018-10-31 ENCOUNTER — Ambulatory Visit
Admission: RE | Admit: 2018-10-31 | Discharge: 2018-10-31 | Disposition: A | Discharge status: Home / Self Care | Payer: Medicare Other | Source: Ambulatory Visit | Attending: Radiation Oncology | Admitting: Radiation Oncology

## 2018-10-31 VITALS — BP 151/63 | HR 88 | Temp 98.5°F | Resp 18 | Ht 60.0 in | Wt 103.1 lb

## 2018-10-31 DIAGNOSIS — R0602 Shortness of breath: Secondary | ICD-10-CM | POA: Diagnosis not present

## 2018-10-31 DIAGNOSIS — R05 Cough: Secondary | ICD-10-CM | POA: Diagnosis not present

## 2018-10-31 DIAGNOSIS — Z79899 Other long term (current) drug therapy: Secondary | ICD-10-CM | POA: Diagnosis not present

## 2018-10-31 DIAGNOSIS — Z923 Personal history of irradiation: Secondary | ICD-10-CM | POA: Diagnosis not present

## 2018-10-31 DIAGNOSIS — Z85118 Personal history of other malignant neoplasm of bronchus and lung: Secondary | ICD-10-CM | POA: Diagnosis present

## 2018-10-31 DIAGNOSIS — C3411 Malignant neoplasm of upper lobe, right bronchus or lung: Secondary | ICD-10-CM

## 2018-10-31 NOTE — Patient Instructions (Signed)
Coronavirus (COVID-19) Are you at risk?  Are you at risk for the Coronavirus (COVID-19)?  To be considered HIGH RISK for Coronavirus (COVID-19), you have to meet the following criteria:  . Traveled to [place], [place], [place], [place] or [place]; or in the [place] to [place], [place], [place], or [place]; and have fever, cough, and shortness of breath within the last 2 weeks of travel OR . Been in close contact with a person diagnosed with COVID-19 within the last 2 weeks and have fever, cough, and shortness of breath . IF YOU DO NOT MEET THESE CRITERIA, YOU ARE CONSIDERED LOW RISK FOR COVID-19.  What to do if you are HIGH RISK for COVID-19?  . If you are having a medical emergency, call 911. . Seek medical care right away. Before you go to a doctor's office, urgent care or emergency department, call ahead and tell them about your recent travel, contact with someone diagnosed with COVID-19, and your symptoms. You should receive instructions from your physician's office regarding next steps of care.  . When you arrive at healthcare provider, tell the healthcare staff immediately you have returned from visiting [place], [place], [place], [place] or [place]; or traveled in the [place] to [place], [place], [place], or [place]; in the last two weeks or you have been in close contact with a person diagnosed with COVID-19 in the last 2 weeks.   . Tell the health care staff about your symptoms: fever, cough and shortness of breath. . After you have been seen by a medical provider, you will be either: o Tested for (COVID-19) and discharged home on quarantine except to seek medical care if symptoms worsen, and asked to  - Stay home and avoid contact with others until you get your results (4-5 days)  - Avoid travel on public transportation if possible (such as bus, train, or airplane) or o Sent to the Emergency Department by EMS for evaluation, COVID-19 testing, and possible  admission depending on your condition and test results.  What to do if you are LOW RISK for COVID-19?  Reduce your risk of any infection by using the same precautions used for avoiding the common cold or flu:  . Wash your hands often with soap and warm water for at least 20 seconds.  If soap and water are not readily available, use an alcohol-based hand sanitizer with at least 60% alcohol.  . If coughing or sneezing, cover your mouth and nose by coughing or sneezing into the elbow areas of your shirt or coat, into a tissue or into your sleeve (not your hands). . Avoid shaking hands with others and consider head nods or verbal greetings only. . Avoid touching your eyes, nose, or mouth with unwashed hands.  . Avoid close contact with people who are sick. . Avoid places or events with large numbers of people in one location, like concerts or sporting events. . Carefully consider travel plans you have or are making. . If you are planning any travel outside or inside the [place], visit the CDC's Travelers' Health webpage for the latest health notices. . If you have some symptoms but not all symptoms, continue to monitor at home and seek medical attention if your symptoms worsen. . If you are having a medical emergency, call 911.   ADDITIONAL HEALTHCARE OPTIONS FOR PATIENTS  [facility] / e-Visit: [email or web address]         [facility]: [phone]  [facility]: [phone]                   [facility]: [phone]   

## 2018-10-31 NOTE — Progress Notes (Signed)
Pt presents today for f/u with Dr. Sondra Come. Pt denies c/o pain except a "crick in neck". Pt reports that only cough is from allergies. Pt denies hemoptysis. Pt denies difficulty swallowing. Pt reports SOB with walking long distances.  BP (!) 151/63 (BP Location: Right Arm, Patient Position: Sitting)   Pulse 88   Temp 98.5 F (36.9 C) (Temporal)   Resp 18   Ht 5' (1.524 m)   Wt 103 lb 2 oz (46.8 kg)   SpO2 100%   BMI 20.14 kg/m   Wt Readings from Last 3 Encounters:  10/31/18 103 lb 2 oz (46.8 kg)  11/09/17 98 lb (44.5 kg)  10/18/17 97 lb 6.4 oz (44.2 kg)   Loma Sousa, RN BSN

## 2018-10-31 NOTE — Progress Notes (Signed)
Radiation Oncology         [phone] ________________________________  Name: [name] MRN: [id number]  Date: 10/31/2018  DOB: Jun 14, 1929  Follow-Up Visit Note  CC: Shirline Frees, MD  [name], *    ICD-10-CM   1. 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  She underwent chest CT yesterday, 10/30/2018, which showed: mildly decreased treated tumor; unchanged small left lower lobe pulmonary nodule and basilar right upper lobe; no new or suspicious findings.  On review of systems, she reports a "crick" in her neck and denies any other pain. She reports a cough associated with allergies and shortness of breath with walking long distances. She denies hemoptysis and difficulty swallowing.     ALLERGIES:  is allergic to propoxyphene n-acetaminophen; sulfonamide derivatives; and sulfur.  Meds: Current Outpatient Medications  Medication Sig Dispense Refill  . acetaminophen (TYLENOL) 500 MG tablet Take 500 mg by mouth every 8 (eight) hours as needed for mild pain or headache.    Marland Kitchen amLODipine (NORVASC) 10 MG tablet Take 10 mg by mouth daily.    Marland Kitchen AMLODIPINE BESY-BENAZEPRIL HCL PO Take by mouth.    . Ascorbic Acid (VITAMIN C) 500 MG CAPS Take by mouth.    Marland Kitchen aspirin EC 81 MG tablet Take 81 mg by mouth daily.    Marland Kitchen atenolol (TENORMIN) 25 MG tablet Take 25 mg by mouth daily.     Marland Kitchen CRANBERRY PO Take 4,200 mg by mouth daily.     Marland Kitchen denosumab (PROLIA) 60 MG/ML SOLN injection Inject 60 mg into the skin every 6 (six) months. Administer in upper arm, thigh, or abdomen  Next dose is 04-2017    . fluticasone (FLONASE) 50 MCG/ACT nasal spray  fluticasone propionate 50 mcg/actuation nasal spray,suspension    . furosemide (LASIX) 20 MG tablet furosemide 20 mg tablet    . losartan (COZAAR) 50 MG tablet Take 50 mg by mouth daily.     Marland Kitchen omeprazole (PRILOSEC) 40 MG capsule Take 40 mg by mouth daily before breakfast.     . pravastatin (PRAVACHOL) 20 MG tablet Take 20 mg by mouth at bedtime.     . pyridOXINE (VITAMIN B-6) 100 MG tablet Take 100 mg by mouth daily.    . sertraline (ZOLOFT) 50 MG tablet Take 25 mg by mouth 2 (two) times daily.     . vitamin E 400 UNIT capsule Take 400 Units by mouth daily.       No current facility-administered medications for this encounter.    Physical Findings: The patient is in no acute distress. Patient is alert and oriented.  height is 5' (1.524 m) and weight is 103 lb 2 oz (46.8 kg). Her temporal temperature is 98.5 F (36.9 C). Her blood pressure is 151/63 (abnormal) and her pulse is 88. Her respiration is 18 and oxygen saturation is 100%. .  Lungs are clear to auscultation bilaterally. Heart has regular rate and rhythm. No palpable cervical, supraclavicular, or axillary adenopathy. Abdomen soft, non-tender, normal bowel sounds.  Palpation along the neck area reveals no point tenderness palpable mass.  Lab Findings: Lab Results  Component Value Date  WBC 6.1 10/25/2016   HGB 12.2 10/25/2016   HCT 35.5 (L) 10/25/2016   MCV 87.4 10/25/2016   PLT 183 10/25/2016    Radiographic Findings: Ct Chest Wo Contrast  Result Date: 10/30/2018 CLINICAL DATA:  Follow-up lung cancer.  Status post XRT. EXAM: CT CHEST WITHOUT CONTRAST TECHNIQUE: Multidetector CT imaging of the chest was performed following the standard protocol without IV contrast. COMPARISON:  05/04/2017 FINDINGS: Cardiovascular: Heart size appears normal. No pericardial effusion. Aortic atherosclerosis. Three vessel coronary artery atherosclerotic calcifications. Mediastinum/Nodes: Normal appearance of the thyroid gland. The trachea appears  patent and is midline. Normal appearance of the esophagus. No axillary, supraclavicular, mediastinal or hilar adenopathy. Lungs/Pleura: No pleural effusion. Paramediastinal fibrosis and architectural distortion within the right upper lobe is again noted. This appears significantly diminished in size when compared with previous exam. The treated tumor measures 5.4 x 2.0 by 4.7 cm. On the previous exam this area measured 5.8 x 2.5 by 4.5 cm. Scratch set surrounding ground-glass attenuation, volume loss and fibrosis appears decreased in the interval. Faint nodule within the posterior right lower lobe measures 4 mm, image 79/5. Unchanged. Within the anterior basal right upper lobe there is a stable 3 mm lung nodule, image 65/5. Upper Abdomen: No acute abnormality identified. Musculoskeletal: The bones appear osteopenic. No aggressive lytic or sclerotic bone lesions identified. IMPRESSION: 1. Treated tumor with surrounding changes of external beam radiation are again noted in the paramediastinal right upper lobe. This appears mildly decreased from exam from 05/04/2017. 2. Small pulmonary nodule in the left lower lobe and basilar right upper lobe are unchanged. 3. No new or suspicious findings identified. 4. Aortic Atherosclerosis (ICD10-I70.0). Coronary artery calcifications. Electronically Signed   By: Kerby Moors M.D.   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  No evidence of recurrence on clinical exam.  Chest CT scan yesterday shows no progressive disease and no new problems.  Chest CT scan is consistent with treated tumor and associated surrounding changes of external beam radiation therapy.  Plan:  Routine follow-up in 6 months.  A chest CT scan will be performed prior to that follow-up.  -----------------------------------  Blair Promise, PhD, MD  This document serves as a record of services personally performed by Gery Pray, MD. It was created on his behalf by Wilburn Mylar, a trained medical scribe. The creation of this record is based on the scribe's personal observations and the provider's statements to them. This document has been checked and approved by the attending provider.

## 2018-11-04 ENCOUNTER — Ambulatory Visit: Payer: Self-pay | Admitting: Radiation Oncology

## 2019-04-30 ENCOUNTER — Other Ambulatory Visit: Payer: Self-pay | Admitting: Family Medicine

## 2019-05-02 ENCOUNTER — Ambulatory Visit (HOSPITAL_COMMUNITY)
Admission: RE | Admit: 2019-05-02 | Discharge: 2019-05-02 | Disposition: A | Discharge status: Home / Self Care | Payer: Medicare Other | Source: Ambulatory Visit | Attending: Radiation Oncology | Admitting: Radiation Oncology

## 2019-05-02 ENCOUNTER — Other Ambulatory Visit: Payer: Self-pay

## 2019-05-02 ENCOUNTER — Telehealth: Payer: Self-pay | Admitting: *Deleted

## 2019-05-02 DIAGNOSIS — C3411 Malignant neoplasm of upper lobe, right bronchus or lung: Secondary | ICD-10-CM | POA: Diagnosis not present

## 2019-05-02 NOTE — Telephone Encounter (Signed)
CALLED PATIENT TO INFORM FU BEING MOVED TO 05-08-19 @ 1:30 PM, SPOKE WITH PATIENT AND SHE IS AWARE OF  THIS APPT.

## 2019-05-05 ENCOUNTER — Ambulatory Visit: Payer: Medicare Other | Admitting: Radiation Oncology

## 2019-05-08 ENCOUNTER — Other Ambulatory Visit: Payer: Self-pay

## 2019-05-08 ENCOUNTER — Encounter: Payer: Self-pay | Admitting: Radiation Oncology

## 2019-05-08 ENCOUNTER — Ambulatory Visit
Admission: RE | Admit: 2019-05-08 | Discharge: 2019-05-08 | Disposition: A | Discharge status: Home / Self Care | Payer: Medicare Other | Source: Ambulatory Visit | Attending: Radiation Oncology | Admitting: Radiation Oncology

## 2019-05-08 VITALS — BP 165/66 | HR 100 | Temp 99.1°F | Resp 18 | Ht 60.0 in | Wt 103.0 lb

## 2019-05-08 DIAGNOSIS — C3411 Malignant neoplasm of upper lobe, right bronchus or lung: Secondary | ICD-10-CM

## 2019-05-08 NOTE — Progress Notes (Signed)
Radiation Oncology         [phone] ________________________________  Name: [name] MRN: [id number]  Date: 05/08/2019  DOB: 11/24/1929  Follow-Up Visit Note  CC: Paige Frees, MD  [name], *    ICD-10-CM   1. 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  She underwent chest CT on 05/02/2019 showing: stable post-treatment scarring in medial RUL; no new or progressive findings.  On review of systems, she her breathing is stable.  She denies any pain in the chest area significant cough or hemoptysis.  She denies any new bony pain headaches dizziness or blurred vision  ALLERGIES:  is allergic to propoxyphene n-acetaminophen; sulfonamide derivatives; and sulfur.  Meds: Current Outpatient Medications  Medication Sig Dispense Refill  . acetaminophen (TYLENOL) 500 MG tablet Take 500 mg by mouth every 8 (eight) hours as needed for mild pain or headache.    Marland Kitchen amLODipine (NORVASC) 10 MG tablet Take 10 mg by mouth daily.    Marland Kitchen AMLODIPINE BESY-BENAZEPRIL HCL PO Take by mouth.    . Ascorbic Acid (VITAMIN C) 500 MG CAPS Take by mouth.    Marland Kitchen aspirin EC 81 MG tablet Take 81 mg by mouth daily.    Marland Kitchen atenolol (TENORMIN) 25 MG tablet Take 25 mg by mouth daily.     Marland Kitchen CRANBERRY PO Take 4,200 mg by mouth daily.     Marland Kitchen denosumab (PROLIA) 60 MG/ML SOLN injection Inject 60 mg into the skin every 6 (six) months. Administer in upper arm, thigh, or abdomen  Next dose is 04-2017    . fluticasone (FLONASE) 50 MCG/ACT nasal spray fluticasone propionate 50 mcg/actuation nasal spray,suspension    . furosemide (LASIX) 20 MG tablet furosemide 20 mg  tablet    . losartan (COZAAR) 50 MG tablet Take 50 mg by mouth daily.     Marland Kitchen omeprazole (PRILOSEC) 40 MG capsule Take 40 mg by mouth daily before breakfast.     . pravastatin (PRAVACHOL) 20 MG tablet Take 20 mg by mouth at bedtime.     . pyridOXINE (VITAMIN B-6) 100 MG tablet Take 100 mg by mouth daily.    . sertraline (ZOLOFT) 50 MG tablet Take 25 mg by mouth 2 (two) times daily.     . vitamin E 400 UNIT capsule Take 400 Units by mouth daily.      Marland Kitchen triamcinolone cream (KENALOG) 0.1 % APPLY TO AFFECTED AREAS TWICE A DAY 2 WEEKS ON 1 WEEK OFF AND THEN AS NEEDED FLARE/ITCH     No current facility-administered medications for this encounter.   Physical Findings: The patient is in no acute distress. Patient is alert and oriented.  height is 5' (1.524 m) and weight is 103 lb (46.7 kg). Her temperature is 99.1 F (37.3 C). Her blood pressure is 165/66 (abnormal) and her pulse is 100. Her respiration is 18 and oxygen saturation is 99%. .  Lungs are clear to auscultation bilaterally. Heart has regular rate and rhythm. No palpable cervical, supraclavicular, or axillary adenopathy. Abdomen soft, non-tender, normal bowel sounds.    Lab Findings: Lab Results  Component Value Date   WBC 6.1  10/25/2016   HGB 12.2 10/25/2016   HCT 35.5 (L) 10/25/2016   MCV 87.4 10/25/2016   PLT 183 10/25/2016    Radiographic Findings: CT Chest Wo Contrast  Result Date: 05/02/2019 CLINICAL DATA:  Non-small-cell lung cancer. Restaging. EXAM: CT CHEST WITHOUT CONTRAST TECHNIQUE: Multidetector CT imaging of the chest was performed following the standard protocol without IV contrast. COMPARISON:  10/30/2018 FINDINGS: Cardiovascular: The heart size is normal. No substantial pericardial effusion. Coronary artery calcification is evident. Atherosclerotic calcification is noted in the wall of the thoracic aorta. Mediastinum/Nodes: Small precarinal and subcarinal nodes are stable. No evidence for gross hilar lymphadenopathy  although assessment is limited by the lack of intravenous contrast on today's study. The esophagus has normal imaging features. There is no axillary lymphadenopathy. Lungs/Pleura: Stable consolidative opacity in the suprahilar right upper lobe compatible with treatment related scarring. Appearance is stable in the interval. Small focus of scarring in the superior segment right lower lobe, retracting the major fissure, is also stable. 4 mm posterior left lower lobe nodule on 73/5 is unchanged. 3 mm right upper lobe nodule (74/5) is stable. No focal airspace consolidation. No pleural effusion. Upper Abdomen: Unremarkable. Musculoskeletal: Densely sclerotic 6 mm lesion in T10 is stable, likely bone island. IMPRESSION: 1. Stable post treatment scarring in the medial right upper lobe. No new or progressive findings on today's study. 2. Tiny bilateral pulmonary nodules are unchanged. Continued attention on follow-up recommended. 3.  Emphysema (ICD10-J43.9) and Aortic Atherosclerosis (ICD10-170.0) Electronically Signed   By: Misty Stanley M.D.   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  No evidence of recurrence on clinical exam.  Recent chest CT scan shows no progressive disease and no new problems.   Plan:  Routine follow-up in 6 months.  A chest CT scan will be performed prior to that follow-up.   25 minutes of total time was spent for this patient encounter, including preparation, face-to-face counseling with the patient and coordination of care, physical exam, and documentation of the encounter.  -----------------------------------  Paige Promise, PhD, MD  This document serves as a record of services personally performed by Gery Pray, MD. It was created on his behalf by Wilburn Mylar, a trained medical scribe. The creation of this record is based on the scribe's personal observations and the provider's statements to  them. This document has been checked and approved by the attending provider.

## 2019-08-29 ENCOUNTER — Ambulatory Visit
Admission: RE | Admit: 2019-08-29 | Discharge: 2019-08-29 | Disposition: A | Discharge status: Home / Self Care | Payer: Medicare Other | Source: Ambulatory Visit | Attending: Family Medicine | Admitting: Family Medicine

## 2019-08-29 ENCOUNTER — Other Ambulatory Visit: Payer: Self-pay

## 2019-08-29 DIAGNOSIS — M810 Age-related osteoporosis without current pathological fracture: Secondary | ICD-10-CM

## 2019-08-29 DIAGNOSIS — Z1231 Encounter for screening mammogram for malignant neoplasm of breast: Secondary | ICD-10-CM

## 2019-09-03 ENCOUNTER — Other Ambulatory Visit: Payer: Self-pay | Admitting: Family Medicine

## 2019-09-03 DIAGNOSIS — R928 Other abnormal and inconclusive findings on diagnostic imaging of breast: Secondary | ICD-10-CM

## 2019-09-19 ENCOUNTER — Ambulatory Visit
Admission: RE | Admit: 2019-09-19 | Discharge: 2019-09-19 | Disposition: A | Discharge status: Home / Self Care | Payer: Medicare Other | Source: Ambulatory Visit | Attending: Family Medicine | Admitting: Family Medicine

## 2019-09-19 ENCOUNTER — Ambulatory Visit: Payer: Medicare Other

## 2019-09-19 ENCOUNTER — Other Ambulatory Visit: Payer: Medicare Other

## 2019-09-19 ENCOUNTER — Other Ambulatory Visit: Payer: Self-pay

## 2019-09-19 DIAGNOSIS — R928 Other abnormal and inconclusive findings on diagnostic imaging of breast: Secondary | ICD-10-CM

## 2019-11-06 ENCOUNTER — Ambulatory Visit (HOSPITAL_COMMUNITY)
Admission: RE | Admit: 2019-11-06 | Discharge: 2019-11-06 | Disposition: A | Discharge status: Home / Self Care | Payer: Medicare Other | Source: Ambulatory Visit | Attending: Radiation Oncology | Admitting: Radiation Oncology

## 2019-11-06 ENCOUNTER — Other Ambulatory Visit: Payer: Self-pay

## 2019-11-06 DIAGNOSIS — C3411 Malignant neoplasm of upper lobe, right bronchus or lung: Secondary | ICD-10-CM | POA: Insufficient documentation

## 2019-11-09 NOTE — Progress Notes (Signed)
Radiation Oncology         [phone] ________________________________  Name: [name] MRN: [id number]  Date: 11/10/2019  DOB: [birth year]  Follow-Up Visit Note  CC: Shirline Frees, MD  [name], *    ICD-10-CM   1. 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 Since her last visit, she underwent a bilateral screening mammogram on 08/29/2019 that showed a possible distortion in the right breast. She then underwent a unilateral diagnostic mammogram on 09/19/2019 that was negative and did not show any evidence of malignancy.  Restaging chest CT scan on 11/06/2019 showed a stable appearance of the para-mediastinal mass-like architectural distortion with volume loss and fibrosis that was compatible with changes secondary to external beam radiation. There were no specific findings identified to suggest local tumor recurrence or metastatic disease. However, there was a new 4 mm left lower lobe lung nodule that was non-specific. Close interval surveillance was advised. Two additional small lung nodules were unchanged when compared to prior exam. Finally, there was a stable sclerotic lesion within the T10 vertebra that was likely benign bone.  On review of systems, she reports three pre-cancerous lesions on her right leg that are being followed by a dermatologist. She denies chest pain, cough, shortness of breath, and poor appetite.  She denies any hemoptysis.  ALLERGIES:  is allergic to propoxyphene n-acetaminophen, sulfonamide derivatives, and sulfur.  Meds: Current Outpatient Medications  Medication Sig Dispense Refill  . acetaminophen  (TYLENOL) 500 MG tablet Take 500 mg by mouth every 8 (eight) hours as needed for mild pain or headache.    Marland Kitchen amLODipine (NORVASC) 10 MG tablet Take 10 mg by mouth daily.    Marland Kitchen AMLODIPINE BESY-BENAZEPRIL HCL PO Take by mouth.    . Ascorbic Acid (VITAMIN C) 500 MG CAPS Take by mouth.    Marland Kitchen atenolol (TENORMIN) 25 MG tablet Take 25 mg by mouth daily.     Marland Kitchen CRANBERRY PO Take 4,200 mg by mouth daily.     Marland Kitchen denosumab (PROLIA) 60 MG/ML SOLN injection Inject 60 mg into the skin every 6 (six) months. Administer in upper arm, thigh, or abdomen  Next dose is 04-2017    . fluticasone (FLONASE) 50 MCG/ACT nasal spray fluticasone propionate 50 mcg/actuation nasal spray,suspension    . losartan (COZAAR) 25 MG tablet Take 50 mg by mouth daily.    Marland Kitchen losartan (COZAAR) 50 MG tablet Take 50 mg by mouth daily.     Marland Kitchen omeprazole (PRILOSEC) 40 MG capsule Take 40 mg by mouth daily before breakfast.     . pravastatin (PRAVACHOL) 20 MG tablet Take 20 mg by mouth at bedtime.     . pyridOXINE (VITAMIN B-6) 100 MG tablet Take 100 mg by mouth daily.    . sertraline (ZOLOFT) 50 MG tablet Take 25 mg by mouth 2 (two) times daily.     Marland Kitchen triamcinolone cream (KENALOG) 0.1 % APPLY TO AFFECTED AREAS TWICE A DAY 2 WEEKS ON 1 WEEK OFF AND THEN AS NEEDED FLARE/ITCH    . vitamin E 400 UNIT capsule Take 400 Units by mouth daily.       No current facility-administered medications for this  encounter.   Physical Findings: The patient is in no acute distress. Patient is alert and oriented.  height is 5' (1.524 m) and weight is 98 lb 2 oz (44.5 kg). Her temporal temperature is 97.6 F (36.4 C). Her blood pressure is 179/64 (abnormal) and her pulse is 79. Her respiration is 18 and oxygen saturation is 99%. .  Lungs are clear to auscultation bilaterally. Heart has regular rate and rhythm. No palpable cervical, supraclavicular, or axillary adenopathy. Abdomen soft, non-tender, normal bowel sounds.   Lab Findings: Lab Results  Component Value  Date   WBC 6.1 10/25/2016   HGB 12.2 10/25/2016   HCT 35.5 (L) 10/25/2016   MCV 87.4 10/25/2016   PLT 183 10/25/2016    Radiographic Findings: CT Chest Wo Contrast  Result Date: 11/06/2019 CLINICAL DATA:  Non-small cell lung cancer. Assess treatment response. Restaging. EXAM: CT CHEST WITHOUT CONTRAST TECHNIQUE: Multidetector CT imaging of the chest was performed following the standard protocol without IV contrast. COMPARISON:  05/02/2019 FINDINGS: Cardiovascular: Normal heart size. No pericardial effusion. Aortic atherosclerosis. Coronary artery calcifications. Mediastinum/Nodes: Normal appearance of the thyroid gland. The trachea appears patent and is midline. Normal appearance of the esophagus. No axillary, supraclavicular, mediastinal, or hilar adenopathy. Lungs/Pleura: No pleural effusion identified. Right upper lobe paramediastinal masslike architectural distortion with volume loss and fibrosis is again noted compatible with changes secondary to external beam radiation. This is unchanged when compared with the previous study. Several small lung nodules are identified: Basilar right upper lobe nodule measuring 3 mm, image 70/5. Unchanged. Posterior left lower lobe lung nodule measuring 4 mm, image 105/5. New from previous exam. Groundglass attenuating nodule within the posterior left lower lobe measuring 3 mm, image 75/5 unchanged from previous exam Upper Abdomen: Normal appearance of the adrenal glands. Asymmetric atrophy of the right kidney. Aortic atherosclerosis with branch vessel involvement. Musculoskeletal: Spondylosis identified within the thoracolumbar spine. Upper thoracic kyphosis noted. Dense sclerotic lesion within the T10 vertebra is again noted and appears similar to previous exam. No acute or suspicious bone lesions. IMPRESSION: 1. Stable appearance of paramediastinal masslike architectural distortion with volume loss and fibrosis compatible with changes secondary to external beam  radiation. No specific findings identified to suggest local tumor recurrence or metastatic disease. 2. New 4 mm left lower lobe lung nodule is identified. Nonspecific. Close interval surveillance advised. Two additional small lung nodules are unchanged when compared with the previous exam. 3. Stable sclerotic lesion within the T10 vertebra. Likely benign bone. 4. Coronary artery calcifications noted. 5. Aortic atherosclerosis. Aortic Atherosclerosis (ICD10-I70.0). Electronically Signed   By: Kerby Moors M.D.   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  No evidence of recurrence on clinical exam today. Recent chest CT scan showed stable post-radiation changes without evidence of local tumor recurrence or metastatic disease. However, there was a new 4 mm left lower lobe lung nodule that was non-specific. Close interval surveillance was advised.  Plan:  Routine follow-up with radiation oncology in six months. A chest CT scan will be performed prior to that visit.  Total time spent in this encounter was 22 minutes which included reviewing the patient's most recent chest CT scan, physical examination, ordering of future chest CT scan, and documentation.  -----------------------------------  Blair Promise, PhD, MD  This document serves as a record of services personally performed by Gery Pray, MD. It was created on his behalf by Clerance Lav, a trained medical  scribe. The creation of this record is based on the scribe's personal observations and the provider's statements to them. This document has been checked and approved by the attending provider.

## 2019-11-10 ENCOUNTER — Encounter: Payer: Self-pay | Admitting: Radiation Oncology

## 2019-11-10 ENCOUNTER — Other Ambulatory Visit: Payer: Self-pay

## 2019-11-10 ENCOUNTER — Ambulatory Visit
Admission: RE | Admit: 2019-11-10 | Discharge: 2019-11-10 | Disposition: A | Discharge status: Home / Self Care | Payer: Medicare Other | Source: Ambulatory Visit | Attending: Radiation Oncology | Admitting: Radiation Oncology

## 2019-11-10 DIAGNOSIS — C3411 Malignant neoplasm of upper lobe, right bronchus or lung: Secondary | ICD-10-CM | POA: Insufficient documentation

## 2019-11-10 DIAGNOSIS — Z79899 Other long term (current) drug therapy: Secondary | ICD-10-CM | POA: Insufficient documentation

## 2019-11-10 DIAGNOSIS — I700 Atherosclerosis of aorta: Secondary | ICD-10-CM | POA: Diagnosis not present

## 2019-11-10 DIAGNOSIS — Z923 Personal history of irradiation: Secondary | ICD-10-CM | POA: Insufficient documentation

## 2019-11-10 NOTE — Progress Notes (Signed)
Patient here for a f/u visit and to review her CT scan. Patient denies pain or coughing. Patient has a good appetite and denies shortness of breath. Patient reports 3 precancerous lesions on her right leg this year. She sees her dermatologist for this. Sees him every 6 months.  BP (!) 179/64 (BP Location: Left Arm, Patient Position: Sitting)   Pulse 79   Temp 97.6 F (36.4 C) (Temporal)   Resp 18   Ht 5' (1.524 m)   Wt 98 lb 2 oz (44.5 kg)   SpO2 99%   BMI 19.16 kg/m   Wt Readings from Last 3 Encounters:  11/10/19 98 lb 2 oz (44.5 kg)  05/08/19 103 lb (46.7 kg)  10/31/18 103 lb 2 oz (46.8 kg)

## 2020-04-27 ENCOUNTER — Telehealth: Payer: Self-pay | Admitting: *Deleted

## 2020-04-27 NOTE — Telephone Encounter (Signed)
CALLED PATIENT TO INFORM OF CT FOR 05-10-20 - ARRIVAL TIME- 12:45 PM @ [facility], NO RESTRICTIONS TO TEST, PATIENT TO RECEIVE RESULTS FROM DR. KINARD ON 05-13-20, LVM FOR A RETURN CALL

## 2020-04-29 ENCOUNTER — Other Ambulatory Visit: Payer: Self-pay | Admitting: Family Medicine

## 2020-04-29 DIAGNOSIS — M810 Age-related osteoporosis without current pathological fracture: Secondary | ICD-10-CM

## 2020-05-10 ENCOUNTER — Encounter (HOSPITAL_COMMUNITY): Payer: Self-pay

## 2020-05-10 ENCOUNTER — Ambulatory Visit (HOSPITAL_COMMUNITY)
Admission: RE | Admit: 2020-05-10 | Discharge: 2020-05-10 | Disposition: A | Discharge status: Home / Self Care | Payer: Medicare Other | Source: Ambulatory Visit | Attending: Radiation Oncology | Admitting: Radiation Oncology

## 2020-05-10 ENCOUNTER — Other Ambulatory Visit: Payer: Self-pay

## 2020-05-10 DIAGNOSIS — C3411 Malignant neoplasm of upper lobe, right bronchus or lung: Secondary | ICD-10-CM | POA: Diagnosis present

## 2020-05-10 HISTORY — DX: Malignant neoplasm of unspecified part of unspecified bronchus or lung: C34.90

## 2020-05-13 ENCOUNTER — Ambulatory Visit
Admission: RE | Admit: 2020-05-13 | Discharge: 2020-05-13 | Disposition: A | Discharge status: Home / Self Care | Payer: Medicare Other | Source: Ambulatory Visit | Attending: Radiation Oncology | Admitting: Radiation Oncology

## 2020-05-13 ENCOUNTER — Other Ambulatory Visit: Payer: Self-pay

## 2020-05-13 ENCOUNTER — Encounter: Payer: Self-pay | Admitting: Radiation Oncology

## 2020-05-13 DIAGNOSIS — Z85118 Personal history of other malignant neoplasm of bronchus and lung: Secondary | ICD-10-CM | POA: Diagnosis present

## 2020-05-13 DIAGNOSIS — Z79899 Other long term (current) drug therapy: Secondary | ICD-10-CM | POA: Insufficient documentation

## 2020-05-13 DIAGNOSIS — Z923 Personal history of irradiation: Secondary | ICD-10-CM | POA: Insufficient documentation

## 2020-05-13 DIAGNOSIS — I2510 Atherosclerotic heart disease of native coronary artery without angina pectoris: Secondary | ICD-10-CM | POA: Insufficient documentation

## 2020-05-13 DIAGNOSIS — I700 Atherosclerosis of aorta: Secondary | ICD-10-CM | POA: Insufficient documentation

## 2020-05-13 DIAGNOSIS — C3411 Malignant neoplasm of upper lobe, right bronchus or lung: Secondary | ICD-10-CM

## 2020-05-13 NOTE — Progress Notes (Signed)
Radiation Oncology         [phone] ________________________________  Name: [name] MRN: [id number]  Date: 05/13/2020  DOB: 12-11-1929  Follow-Up Visit Note  CC: Shirline Frees, MD  [name], *    ICD-10-CM   1. 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  Since her last follow-up the patient underwent a chest CT scan on May 11, 2020.  This showed post radiation and surgical changes in the right upper lobe.  No evidence of lung cancer recurrence or progression.  On review of systems,  She denies chest pain, cough, shortness of breath, and poor appetite.  She denies any hemoptysis.  She reports no new medical issues since last follow-up  ALLERGIES:  is allergic to elemental sulfur, propoxyphene n-acetaminophen, and sulfonamide derivatives.  Meds: Current Outpatient Medications  Medication Sig Dispense Refill  . acetaminophen (TYLENOL) 500 MG tablet Take 500 mg by mouth every 8 (eight) hours as needed for mild pain or headache.    . AMLODIPINE BESY-BENAZEPRIL HCL PO Take by mouth.    . Ascorbic Acid (VITAMIN C) 500 MG CAPS Take by mouth.    Marland Kitchen atenolol (TENORMIN) 25 MG tablet Take 25 mg by mouth daily.     Marland Kitchen CRANBERRY PO Take 4,200 mg by mouth daily.    Marland Kitchen denosumab (PROLIA) 60 MG/ML SOLN injection Inject 60 mg into the skin every 6 (six) months. Administer in upper arm, thigh, or abdomen  Next dose is 04-2017    . losartan (COZAAR) 25 MG tablet Take 50 mg by mouth daily.    Marland Kitchen omeprazole (PRILOSEC) 40 MG capsule Take 40 mg by mouth daily before breakfast.     . pravastatin (PRAVACHOL) 20 MG tablet Take 20 mg by mouth at bedtime.      . pyridOXINE (VITAMIN B-6) 100 MG tablet Take 100 mg by mouth daily.    . sertraline (ZOLOFT) 50 MG tablet Take 25 mg by mouth 2 (two) times daily.     Marland Kitchen triamcinolone cream (KENALOG) 0.1 % APPLY TO AFFECTED AREAS TWICE A DAY 2 WEEKS ON 1 WEEK OFF AND THEN AS NEEDED FLARE/ITCH    . vitamin E 400 UNIT capsule Take 400 Units by mouth daily.    Marland Kitchen amLODipine (NORVASC) 10 MG tablet Take 10 mg by mouth daily. (Patient not taking: Reported on 05/13/2020)    . fluticasone (FLONASE) 50 MCG/ACT nasal spray fluticasone propionate 50 mcg/actuation nasal spray,suspension (Patient not taking: Reported on 05/13/2020)    . losartan (COZAAR) 50 MG tablet Take 50 mg by mouth daily.  (Patient not taking: Reported on 05/13/2020)     No current facility-administered medications for this encounter.   Physical Findings: The patient is in no acute distress. Patient is alert and oriented.  height is 5' (1.524 m) and weight is 95 lb 3.2 oz (43.2 kg). Her temperature is 97.5 F (36.4 C) (abnormal). Her blood pressure is 170/67 (abnormal) and her pulse is 75. Her respiration is 18 and oxygen saturation is 100%. .  Lungs are clear to auscultation bilaterally. Heart has regular rate and rhythm. No palpable cervical, supraclavicular, or axillary adenopathy. Abdomen soft, non-tender, normal bowel  sounds.   Lab Findings: Lab Results  Component Value Date   WBC 6.1 10/25/2016   HGB 12.2 10/25/2016   HCT 35.5 (L) 10/25/2016   MCV 87.4 10/25/2016   PLT 183 10/25/2016    Radiographic Findings: CT Chest Wo Contrast  Result Date: 05/11/2020 CLINICAL DATA:  Non-small cell lung cancer. Assess treatment response EXAM: CT CHEST WITHOUT CONTRAST TECHNIQUE: Multidetector CT imaging of the chest was performed following the standard protocol without IV contrast. COMPARISON:  11/06/2019 FINDINGS: Cardiovascular: Coronary artery calcification and aortic atherosclerotic calcification. Mediastinum/Nodes: No axillary or supraclavicular  adenopathy. No mediastinal or hilar adenopathy. No pericardial fluid. Esophagus normal. Lungs/Pleura: Soft tissue consolidation in the RIGHT suprahilar lung along the mediastinal border measures 3.5 by 1.8 cm unchanged from comparison exam. No new or suspicious nodularity within the RIGHT lung. Stable 2 mm nodule in the RIGHT middle lobe (image 63/series 7) Small round nodule in the LEFT lower lobe measures 4 mm (image 76/series 7) not changed from comparison CT Upper Abdomen: Limited view of the liver, kidneys, pancreas are unremarkable. Normal adrenal glands. Musculoskeletal: No aggressive osseous lesion. IMPRESSION: 1. Post radiation and surgical change in the RIGHT upper lobe. 2. Stable bilateral small pulmonary nodules. 3. No evidence of lung cancer recurrence or progression. Electronically Signed   By: Suzy Bouchard M.D.   On: 05/11/2020 16:55    Impression:  Clinicalstage I Adenocarcinomaof theupper lobe of the right lung s/p SBRT.  No evidence of recurrence on clinical exam today. Recent chest CT scan showed stable post-radiation changes without evidence of local tumor recurrence or metastatic disease.   Plan:  Routine follow-up with radiation oncology in six months. A chest CT scan will be performed prior to that visit.  Total time spent in this encounter was 20 minutes which included reviewing the patient's most recent chest CT scan, physical examination, ordering of future chest CT scan, and documentation.  -----------------------------------  Blair Promise, PhD, MD  This document serves as a record of services personally performed by Gery Pray, MD. It was created on his behalf by Clerance Lav, a trained medical scribe. The creation of this record is based on the scribe's personal observations and the provider's statements to them. This document has been checked and approved by the attending provider.

## 2020-05-13 NOTE — Progress Notes (Signed)
[name] is here today for follow up post radiation to the lung.  Lung Side: Right  Does the patient complain of any of the following: . Pain:Denies any pain.  Marland Kitchen Shortness of breath w/wo exertion: no . Cough: intermittent cough . Hemoptysis:no . Pain with swallowing: no  . Swallowing/choking concerns: no . Appetite: good . Energy Level: good energy  . Post radiation skin Changes: Denies any skin issues.     Additional comments if applicable:  Vitals:   76/80/88 1127  BP: (!) 170/67  Pulse: 75  Resp: 18  Temp: (!) 97.5 F (36.4 C)  SpO2: 100%  Weight: 95 lb 3.2 oz (43.2 kg)  Height: 5' (1.524 m)

## 2020-06-24 ENCOUNTER — Other Ambulatory Visit: Payer: Self-pay

## 2020-06-24 ENCOUNTER — Ambulatory Visit
Admission: RE | Admit: 2020-06-24 | Discharge: 2020-06-24 | Disposition: A | Discharge status: Home / Self Care | Payer: Medicare Other | Source: Ambulatory Visit | Attending: Family Medicine | Admitting: Family Medicine

## 2020-06-24 DIAGNOSIS — M810 Age-related osteoporosis without current pathological fracture: Secondary | ICD-10-CM

## 2020-11-02 ENCOUNTER — Telehealth: Payer: Self-pay | Admitting: *Deleted

## 2020-11-02 NOTE — Telephone Encounter (Signed)
Called patient to inform of CT for 11-12-20- arrival time- 12:15 pm @ [facility], no restrictions to test, patient to receive CT results from Dr. Sondra Come on 11-15-20 @ 11:30 am, spoke with patient and she verified understanding these appts.

## 2020-11-04 ENCOUNTER — Telehealth: Payer: Self-pay | Admitting: *Deleted

## 2020-11-04 NOTE — Telephone Encounter (Signed)
CALLED PATIENT TO INFORM OF SCAN BEING MOVED TO 11-26-20 AND HER FU TO 11-29-20, LVM FOR A RETURN CALL

## 2020-11-05 ENCOUNTER — Telehealth: Payer: Self-pay | Admitting: *Deleted

## 2020-11-05 NOTE — Telephone Encounter (Signed)
RETURNED PATIENT'S PHONE CALL, SPOKE WITH PATIENT. ?

## 2020-11-12 ENCOUNTER — Ambulatory Visit (HOSPITAL_COMMUNITY): Payer: Medicare Other

## 2020-11-15 ENCOUNTER — Ambulatory Visit: Payer: Self-pay | Admitting: Radiation Oncology

## 2020-11-26 ENCOUNTER — Ambulatory Visit (HOSPITAL_COMMUNITY)
Admission: RE | Admit: 2020-11-26 | Discharge: 2020-11-26 | Disposition: A | Discharge status: Home / Self Care | Payer: Medicare Other | Source: Ambulatory Visit | Attending: Radiation Oncology | Admitting: Radiation Oncology

## 2020-11-26 ENCOUNTER — Other Ambulatory Visit: Payer: Self-pay

## 2020-11-26 ENCOUNTER — Encounter (HOSPITAL_COMMUNITY): Payer: Self-pay

## 2020-11-26 DIAGNOSIS — C3411 Malignant neoplasm of upper lobe, right bronchus or lung: Secondary | ICD-10-CM | POA: Diagnosis present

## 2020-11-27 NOTE — Progress Notes (Signed)
Radiation Oncology         [phone] ________________________________  Name: [name] MRN: [id number]  Date: 11/29/2020  DOB: March 08, 1929  Follow-Up Visit Note  CC: Shirline Frees, MD  [name], *    ICD-10-CM   1. Primary cancer of right upper lobe of lung ([place])  C34.11 CT CHEST WO CONTRAST      Diagnosis: Clinical stage I Adenocarcinoma of the upper lobe of the right lung  Interval Since Last Radiation: 3 years, 11 months, and 19 days   11/30/2016 - 12/11/2016: Right Lung, SBRT/SRT-3D, 6X-FFF, 50 Gy delivered in 5 fractions   Narrative:  The patient returns today for routine follow-up and to review recent imaging.  Chest CT without contrast on 11/26/20 demonstrated unchanged post treatment/post radiation appearance of the paramedian suprahilar right upper lobe, and no evidence of recurrent or metastatic disease in the chest..   Other imaging includes DXA for bone density on 06/24/20 which revealed the patient as osteoporotic according to [facility] criteria; with a forearm radius T-score of -4.1.                              Allergies:  is allergic to elemental sulfur, propoxyphene n-acetaminophen, and sulfonamide derivatives.  Meds: Current Outpatient Medications  Medication Sig Dispense Refill   acetaminophen (TYLENOL) 500 MG tablet Take 500 mg by mouth every 8 (eight) hours as needed for mild pain or headache.     amLODipine (NORVASC) 10 MG tablet Take 10 mg by mouth daily.     Ascorbic Acid (VITAMIN C) 500 MG CAPS Take by mouth.     atenolol (TENORMIN) 25 MG tablet Take 25 mg by mouth daily.      CRANBERRY PO Take 4,200 mg by mouth daily.     denosumab (PROLIA) 60 MG/ML SOLN injection Inject 60 mg into the skin every 6 (six) months. Administer in upper arm, thigh, or abdomen  Next dose is 04-2017     losartan (COZAAR) 25 MG tablet Take 50 mg by mouth daily.     omeprazole (PRILOSEC) 40 MG capsule Take 40 mg by mouth daily before breakfast.       pravastatin (PRAVACHOL) 20 MG tablet Take 20 mg by mouth at bedtime.      pyridOXINE (VITAMIN B-6) 100 MG tablet Take 100 mg by mouth daily.     vitamin E 400 UNIT capsule Take 400 Units by mouth daily.     AMLODIPINE BESY-BENAZEPRIL HCL PO Take by mouth.     losartan (COZAAR) 50 MG tablet Take 50 mg by mouth daily.  (Patient not taking: Reported on 11/29/2020)     sertraline (ZOLOFT) 50 MG tablet Take 25 mg by mouth 2 (two) times daily.  (Patient not taking: Reported on 11/29/2020)     triamcinolone cream (KENALOG) 0.1 % APPLY TO AFFECTED AREAS TWICE A DAY 2 WEEKS ON 1 WEEK OFF AND THEN AS NEEDED FLARE/ITCH (Patient not taking: Reported on 11/29/2020)     No current facility-administered medications for this encounter.    Physical Findings: The patient is in no acute distress. Patient is alert and oriented.  height is 5' (1.524 m) and weight is 96 lb 6 oz (43.7 kg). Her temporal temperature is 97.2 F (36.2 C) (abnormal). Her blood pressure is 182/66 (abnormal) and her pulse is 89. Her respiration is 20 and oxygen saturation is 100%. .  No significant changes. Lungs are clear to auscultation  bilaterally. Heart has regular rate and rhythm. No palpable cervical, supraclavicular, or axillary adenopathy. Abdomen soft, non-tender, normal bowel sounds.    Lab Findings: Lab Results  Component Value Date   WBC 6.1 10/25/2016   HGB 12.2 10/25/2016   HCT 35.5 (L) 10/25/2016   MCV 87.4 10/25/2016   PLT 183 10/25/2016    Radiographic Findings: CT CHEST WO CONTRAST  Result Date: 11/29/2020 CLINICAL DATA:  Non-small cell lung cancer, assess treatment response, status post right upper lobe resection and XRT EXAM: CT CHEST WITHOUT CONTRAST TECHNIQUE: Multidetector CT imaging of the chest was performed following the standard protocol without IV contrast. COMPARISON:  05/10/2020 FINDINGS: Cardiovascular: Aortic atherosclerosis. Normal heart size. Three-vessel coronary artery calcifications no  pericardial effusion. Mediastinum/Nodes: No enlarged mediastinal, hilar, or axillary lymph nodes. Thyroid gland, trachea, and esophagus demonstrate no significant findings. Lungs/Pleura: Unchanged post treatment/post radiation appearance of the paramedian suprahilar right upper lobe (series 7, image 35). No pleural effusion or pneumothorax. Upper Abdomen: No acute abnormality. Atrophic appearance of the right kidney, partially imaged. Musculoskeletal: No chest wall mass or suspicious bone lesions identified. IMPRESSION: 1. Unchanged post treatment/post radiation appearance of the paramedian suprahilar right upper lobe. No evidence of recurrent or metastatic disease in the chest. 2. Coronary artery disease. Aortic Atherosclerosis (ICD10-I70.0). Electronically Signed   By: Delanna Ahmadi M.D.   On: 11/29/2020 10:53    Impression:  Clinical stage I Adenocarcinoma of the upper lobe of the right lung  No evidence of recurrence on clinical exam today and recent chest CT scan.  The patient does not exhibit any long-term effects from her radiation therapy.  Plan: Follow-up in 6 months.  Prior to this follow-up appointment the patient will undergo repeat CT scan of the chest.   22 minutes of total time was spent for this patient encounter, including preparation, face-to-face counseling with the patient and coordination of care, physical exam, and documentation of the encounter. ____________________________________  Blair Promise, PhD, MD   This document serves as a record of services personally performed by Gery Pray, MD. It was created on his behalf by Roney Mans, a trained medical scribe. The creation of this record is based on the scribe's personal observations and the provider's statements to them. This document has been checked and approved by the attending provider.

## 2020-11-29 ENCOUNTER — Other Ambulatory Visit: Payer: Self-pay

## 2020-11-29 ENCOUNTER — Ambulatory Visit
Admission: RE | Admit: 2020-11-29 | Discharge: 2020-11-29 | Disposition: A | Discharge status: Home / Self Care | Payer: Medicare Other | Source: Ambulatory Visit | Attending: Radiation Oncology | Admitting: Radiation Oncology

## 2020-11-29 ENCOUNTER — Encounter: Payer: Self-pay | Admitting: Radiation Oncology

## 2020-11-29 DIAGNOSIS — C3411 Malignant neoplasm of upper lobe, right bronchus or lung: Secondary | ICD-10-CM

## 2020-11-29 DIAGNOSIS — I2510 Atherosclerotic heart disease of native coronary artery without angina pectoris: Secondary | ICD-10-CM | POA: Diagnosis not present

## 2020-11-29 DIAGNOSIS — Z79899 Other long term (current) drug therapy: Secondary | ICD-10-CM | POA: Diagnosis not present

## 2020-11-29 DIAGNOSIS — I700 Atherosclerosis of aorta: Secondary | ICD-10-CM | POA: Diagnosis not present

## 2020-11-29 DIAGNOSIS — Z85118 Personal history of other malignant neoplasm of bronchus and lung: Secondary | ICD-10-CM | POA: Insufficient documentation

## 2020-11-29 DIAGNOSIS — M810 Age-related osteoporosis without current pathological fracture: Secondary | ICD-10-CM | POA: Diagnosis not present

## 2020-11-29 NOTE — Progress Notes (Signed)
[name] is here today for follow up post radiation to the lung.  Lung Side: Right, completed treatment on 12/11/16  Does the patient complain of any of the following: Pain: Patient denies pain Shortness of breath w/wo exertion: no Cough: dry cough at times Hemoptysis: no Pain with swallowing: no Swallowing/choking concerns: no Appetite: Good Energy Level: good  Post radiation skin Changes: no    Additional comments if applicable:  Vitals:   94/50/38 1151  BP: (!) 182/66  Pulse: 89  Resp: 20  Temp: (!) 97.2 F (36.2 C)  TempSrc: Temporal  SpO2: 100%  Weight: 96 lb 6 oz (43.7 kg)  Height: 5' (1.524 m)

## 2021-05-17 ENCOUNTER — Telehealth: Payer: Self-pay | Admitting: *Deleted

## 2021-05-17 NOTE — Telephone Encounter (Signed)
CALLED PATIENT TO INFORM OF CT FOR 05-27-21- ARRIVAL TIME- 1 PM @ [facility], NO RESTRICTIONS TO TEST, PATIENT TO RECEIVE CT RESULTS FROM DR. KINARD ON 05-30-21 @ 11:15 AM, LVM FOR A RETURN CALL ?

## 2021-05-27 ENCOUNTER — Ambulatory Visit (HOSPITAL_COMMUNITY)
Admission: RE | Admit: 2021-05-27 | Discharge: 2021-05-27 | Disposition: A | Discharge status: Home / Self Care | Payer: Medicare Other | Source: Ambulatory Visit | Attending: Radiation Oncology | Admitting: Radiation Oncology

## 2021-05-27 DIAGNOSIS — C3411 Malignant neoplasm of upper lobe, right bronchus or lung: Secondary | ICD-10-CM | POA: Insufficient documentation

## 2021-05-28 NOTE — Progress Notes (Incomplete)
?Radiation Oncology         [phone] ?________________________________ ? ?Name: [name] MRN: [id number]  ?Date: 05/30/2021  DOB: 1929/03/20 ? ?Follow-Up Visit Note ? ?CC: Shirline Frees, MD  [name], * ? ?No diagnosis found. ? 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 ?Interval Since Last Radiation: 4 years, 5 months, and 19 days  ? ?11/30/2016 - 12/11/2016: Right Lung, SBRT/SRT-3D, 6X-FFF, 50 Gy delivered in 5 fractions  ? ?Narrative:  The patient returns today for routine follow-up and to review recent imaging, she was last seen here for follow up on 11/29/20. Her most recent chest CT on 05/27/21 showed stable treatment related findings in the right upper lobe, and no evidence of active malignancy. Other findings of potential clinical significance on CT included an atrophic appearing upper pole of the right kidney, aortic atherosclerosis, and coronary and systemic atherosclerosis.       ? ?Otherwise, no significant interval history since the patient was last seen.  ? ?***                       ? ?Allergies:  is allergic to elemental sulfur, propoxyphene n-acetaminophen, and sulfonamide derivatives. ? ?Meds: ?Current Outpatient Medications  ?Medication Sig Dispense Refill  ? acetaminophen (TYLENOL) 500 MG tablet Take 500 mg by mouth every 8 (eight) hours as needed for mild pain or headache.    ? amLODipine (NORVASC) 10 MG tablet Take 10 mg by mouth daily.    ? AMLODIPINE BESY-BENAZEPRIL HCL PO Take by mouth.    ? Ascorbic Acid (VITAMIN C) 500 MG CAPS Take by mouth.    ? atenolol (TENORMIN) 25 MG tablet Take 25 mg by mouth daily.     ? CRANBERRY PO Take 4,200 mg by mouth daily.    ? denosumab (PROLIA) 60 MG/ML SOLN injection Inject 60 mg into the skin every 6 (six) months. Administer in upper arm, thigh, or abdomen  ?Next dose is 04-2017    ? losartan (COZAAR) 25 MG tablet Take 50 mg by mouth daily.    ? losartan (COZAAR) 50 MG tablet Take 50 mg by  mouth daily.  (Patient not taking: Reported on 11/29/2020)    ? omeprazole (PRILOSEC) 40 MG capsule Take 40 mg by mouth daily before breakfast.     ? pravastatin (PRAVACHOL) 20 MG tablet Take 20 mg by mouth at bedtime.     ? pyridOXINE (VITAMIN B-6) 100 MG tablet Take 100 mg by mouth daily.    ? sertraline (ZOLOFT) 50 MG tablet Take 25 mg by mouth 2 (two) times daily.  (Patient not taking: Reported on 11/29/2020)    ? triamcinolone cream (KENALOG) 0.1 % APPLY TO AFFECTED AREAS TWICE A DAY 2 WEEKS ON 1 WEEK OFF AND THEN AS NEEDED FLARE/ITCH (Patient not taking: Reported on 11/29/2020)    ? vitamin E 400 UNIT capsule Take 400 Units by mouth daily.    ? ?No current facility-administered medications for this encounter.  ? ? ?Physical Findings: ?The patient is in no acute distress. Patient is alert and oriented. ? vitals were not taken for this visit. .  No significant changes. Lungs are clear to auscultation bilaterally. Heart has regular rate and rhythm. No palpable cervical, supraclavicular, or axillary adenopathy. Abdomen soft, non-tender, normal bowel sounds. *** ? ? ?Lab Findings: ?Lab Results  ?Component Value Date  ? WBC 6.1 10/25/2016  ? HGB 12.2 10/25/2016  ? HCT  35.5 (L) 10/25/2016  ? MCV 87.4 10/25/2016  ? PLT 183 10/25/2016  ? ? ?Radiographic Findings: ?CT CHEST WO CONTRAST ? ?Result Date: 05/27/2021 ?CLINICAL DATA:  Non-small cell lung cancer restaging. * Tracking Code: BO * EXAM: CT CHEST WITHOUT CONTRAST TECHNIQUE: Multidetector CT imaging of the chest was performed following the standard protocol without IV contrast. RADIATION DOSE REDUCTION: This exam was performed according to the departmental dose-optimization program which includes automated exposure control, adjustment of the mA and/or kV according to patient size and/or use of iterative reconstruction technique. COMPARISON:  11/26/2020 FINDINGS: Cardiovascular: Coronary, aortic arch, and branch vessel atherosclerotic vascular disease.  Mediastinum/Nodes: 0.8 cm right paratracheal lymph node on image 51 series 2, previously 0.7 cm. Lungs/Pleura: Biapical pleuroparenchymal scarring. Stable wedge like opacity medially in the right upper lobe and paramediastinal region compatible with prior radiation therapy, no substantial contour change to indicate progression or active underlying malignancy. Mild stable scarring in the superior segment right lower lobe. Sub solid left lower lobe nodule 0.5 by 0.3 cm on image 72 series 5, stable from 10/10/2016, likely benign. Upper Abdomen: Atrophy of the visualized portion of the right kidney, stable from 11/26/2020 but progressive compared to prior exams from 2018. Partial inclusion of a hypodense left renal lesion which is most likely to be a cyst. Abdominal aortic and branch vessel atherosclerotic vascular disease. Musculoskeletal: Grade 1 anterolisthesis at C7-T1, chronic. IMPRESSION: 1. Stable treatment related findings in the right upper lobe, without progression to suggest active malignancy. 2. Atrophic upper pole the right kidney. 3. Aortic Atherosclerosis (ICD10-I70.0). Coronary and systemic atherosclerosis. Electronically Signed   By: Van Clines M.D.   On: 05/27/2021 16:48   ? ?Impression:  Clinical stage I Adenocarcinoma of the upper lobe of the right lung ? ?The patient is recovering from the effects of radiation.  *** ? ?Plan:  *** ? ? ?*** minutes of total time was spent for this patient encounter, including preparation, face-to-face counseling with the patient and coordination of care, physical exam, and documentation of the encounter. ?____________________________________ ? ?Blair Promise, PhD, MD ? ?This document serves as a record of services personally performed by Gery Pray, MD. It was created on his behalf by Roney Mans, a trained medical scribe. The creation of this record is based on the scribe's personal observations and the provider's statements to them. This document has  been checked and approved by the attending provider. ? ?

## 2021-05-30 ENCOUNTER — Ambulatory Visit
Admission: RE | Admit: 2021-05-30 | Discharge: 2021-05-30 | Disposition: A | Discharge status: Home / Self Care | Payer: Medicare Other | Source: Ambulatory Visit | Attending: Radiation Oncology | Admitting: Radiation Oncology

## 2021-05-30 ENCOUNTER — Telehealth: Payer: Self-pay | Admitting: *Deleted

## 2021-05-30 NOTE — Telephone Encounter (Signed)
RETURNED PATIENT'S PHONE CALL, LVM FOR A RETURN CALL 

## 2021-06-06 NOTE — Progress Notes (Signed)
?Radiation Oncology         [phone]) [phone] ?________________________________ ? ?Name: [name] MRN: [id number]  ?Date: 06/07/2021  DOB: 12-May-1929 ? ?Follow-Up Visit Note ? ?CC: Shirline Frees, MD  [name], * ? ?  ICD-10-CM   ?1. Primary cancer of right upper lobe of lung (HCC)  C34.11 CT CHEST WO CONTRAST  ?  ?2. Ductal carcinoma in situ (DCIS) of left breast  D05.12   ?  ? ? 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 ?Interval Since Last Radiation: 4 years, 5 months, and 27 days  ? ?11/30/2016 - 12/11/2016: Right Lung, SBRT/SRT-3D, 6X-FFF, 50 Gy delivered in 5 fractions  ? ?Narrative:  The patient returns today for routine follow-up and to review recent imaging, she was last seen here for follow up on 11/29/20. Her most recent chest CT on 05/27/21 showed stable treatment related findings in the right upper lobe, and no evidence of active malignancy. Other findings of potential clinical significance on CT included an atrophic appearing upper pole of the right kidney, aortic atherosclerosis, and coronary and systemic atherosclerosis.       ? ?Otherwise, no significant interval history since the patient was last seen.  ? ?She denies any new medical issues.  Her systolic blood pressure is elevated today but she checks it frequently at home and reports systolic blood pressures around 694 and diastolic blood pressure reading is around 65.      ? ?She denies any breathing issues.  She denies any pain within the chest area significant cough or hemoptysis.                  ? ?Allergies:  is allergic to elemental sulfur, propoxyphene n-acetaminophen, and sulfonamide derivatives. ? ?Meds: ?Current Outpatient Medications  ?Medication Sig Dispense Refill  ? acetaminophen (TYLENOL) 500 MG tablet Take 500 mg by mouth every 8 (eight) hours as needed for mild pain or headache.    ? amLODipine (NORVASC) 10 MG tablet Take 10 mg by mouth daily.    ? AMLODIPINE BESY-BENAZEPRIL  HCL PO Take by mouth.    ? Ascorbic Acid (VITAMIN C) 500 MG CAPS Take by mouth.    ? atenolol (TENORMIN) 25 MG tablet Take 25 mg by mouth daily.     ? CRANBERRY PO Take 4,200 mg by mouth daily.    ? denosumab (PROLIA) 60 MG/ML SOLN injection Inject 60 mg into the skin every 6 (six) months. Administer in upper arm, thigh, or abdomen  ?Next dose is 04-2017    ? losartan (COZAAR) 25 MG tablet Take 50 mg by mouth daily.    ? losartan (COZAAR) 50 MG tablet Take 50 mg by mouth daily.  (Patient not taking: Reported on 11/29/2020)    ? omeprazole (PRILOSEC) 40 MG capsule Take 40 mg by mouth daily before breakfast.     ? pravastatin (PRAVACHOL) 20 MG tablet Take 20 mg by mouth at bedtime.     ? pyridOXINE (VITAMIN B-6) 100 MG tablet Take 100 mg by mouth daily.    ? sertraline (ZOLOFT) 50 MG tablet Take 25 mg by mouth 2 (two) times daily.  (Patient not taking: Reported on 11/29/2020)    ? triamcinolone cream (KENALOG) 0.1 % APPLY TO AFFECTED AREAS TWICE A DAY 2 WEEKS ON 1 WEEK OFF AND THEN AS NEEDED FLARE/ITCH (Patient not taking: Reported on 11/29/2020)    ? vitamin E 400 UNIT capsule Take 400 Units by mouth  daily.    ? ?No current facility-administered medications for this encounter.  ? ? ?Physical Findings: ?The patient is in no acute distress. Patient is alert and oriented.  Accompanied by her niece on evaluation today ? height is 5' (1.524 m) and weight is 98 lb 12.8 oz (44.8 kg). Her temperature is 97.6 ?F (36.4 ?C). Her blood pressure is 190/70 (abnormal) and her pulse is 52 (abnormal). Her respiration is 20 and oxygen saturation is 98%. .  No significant changes. Lungs are clear to auscultation bilaterally. Heart has regular rate and rhythm. No palpable cervical, supraclavicular, or axillary adenopathy. Abdomen soft, non-tender, normal bowel sounds.  ? ? ?Lab Findings: ?Lab Results  ?Component Value Date  ? WBC 6.1 10/25/2016  ? HGB 12.2 10/25/2016  ? HCT 35.5 (L) 10/25/2016  ? MCV 87.4 10/25/2016  ? PLT 183 10/25/2016   ? ? ?Radiographic Findings: ?CT CHEST WO CONTRAST ? ?Result Date: 05/27/2021 ?CLINICAL DATA:  Non-small cell lung cancer restaging. * Tracking Code: BO * EXAM: CT CHEST WITHOUT CONTRAST TECHNIQUE: Multidetector CT imaging of the chest was performed following the standard protocol without IV contrast. RADIATION DOSE REDUCTION: This exam was performed according to the departmental dose-optimization program which includes automated exposure control, adjustment of the mA and/or kV according to patient size and/or use of iterative reconstruction technique. COMPARISON:  11/26/2020 FINDINGS: Cardiovascular: Coronary, aortic arch, and branch vessel atherosclerotic vascular disease. Mediastinum/Nodes: 0.8 cm right paratracheal lymph node on image 51 series 2, previously 0.7 cm. Lungs/Pleura: Biapical pleuroparenchymal scarring. Stable wedge like opacity medially in the right upper lobe and paramediastinal region compatible with prior radiation therapy, no substantial contour change to indicate progression or active underlying malignancy. Mild stable scarring in the superior segment right lower lobe. Sub solid left lower lobe nodule 0.5 by 0.3 cm on image 72 series 5, stable from 10/10/2016, likely benign. Upper Abdomen: Atrophy of the visualized portion of the right kidney, stable from 11/26/2020 but progressive compared to prior exams from 2018. Partial inclusion of a hypodense left renal lesion which is most likely to be a cyst. Abdominal aortic and branch vessel atherosclerotic vascular disease. Musculoskeletal: Grade 1 anterolisthesis at C7-T1, chronic. IMPRESSION: 1. Stable treatment related findings in the right upper lobe, without progression to suggest active malignancy. 2. Atrophic upper pole the right kidney. 3. Aortic Atherosclerosis (ICD10-I70.0). Coronary and systemic atherosclerosis. Electronically Signed   By: Van Clines M.D.   On: 05/27/2021 16:48   ? ?Impression:  Clinical stage I Adenocarcinoma of  the upper lobe of the right lung ? ?No evidence of recurrence on clinical exam today.  Recent chest CT scan also very encouraging without evidence of recurrence. ? ?Plan: Routine follow-up in 6 months.  Prior to this follow-up appointment the patient will undergo a repeat CT scan of the chest. ? ? ?20 minutes of total time was spent for this patient encounter, including preparation, face-to-face counseling with the patient and coordination of care, physical exam, and documentation of the encounter. ?____________________________________ ? ?Blair Promise, PhD, MD ? ?This document serves as a record of services personally performed by Gery Pray, MD. It was created on his behalf by Roney Mans, a trained medical scribe. The creation of this record is based on the scribe's personal observations and the provider's statements to them. This document has been checked and approved by the attending provider. ? ?

## 2021-06-07 ENCOUNTER — Other Ambulatory Visit: Payer: Self-pay

## 2021-06-07 ENCOUNTER — Telehealth: Payer: Self-pay | Admitting: *Deleted

## 2021-06-07 ENCOUNTER — Encounter: Payer: Self-pay | Admitting: Radiation Oncology

## 2021-06-07 ENCOUNTER — Ambulatory Visit
Admission: RE | Admit: 2021-06-07 | Discharge: 2021-06-07 | Disposition: A | Discharge status: Home / Self Care | Payer: Medicare Other | Source: Ambulatory Visit | Attending: Radiation Oncology | Admitting: Radiation Oncology

## 2021-06-07 VITALS — BP 190/70 | HR 52 | Temp 97.6°F | Resp 20 | Ht 60.0 in | Wt 98.8 lb

## 2021-06-07 DIAGNOSIS — C3411 Malignant neoplasm of upper lobe, right bronchus or lung: Secondary | ICD-10-CM

## 2021-06-07 DIAGNOSIS — Z923 Personal history of irradiation: Secondary | ICD-10-CM | POA: Insufficient documentation

## 2021-06-07 DIAGNOSIS — Z86000 Personal history of in-situ neoplasm of breast: Secondary | ICD-10-CM | POA: Insufficient documentation

## 2021-06-07 DIAGNOSIS — Z85118 Personal history of other malignant neoplasm of bronchus and lung: Secondary | ICD-10-CM | POA: Diagnosis present

## 2021-06-07 DIAGNOSIS — Z79899 Other long term (current) drug therapy: Secondary | ICD-10-CM | POA: Insufficient documentation

## 2021-06-07 DIAGNOSIS — D0512 Intraductal carcinoma in situ of left breast: Secondary | ICD-10-CM

## 2021-06-07 NOTE — Telephone Encounter (Signed)
CALLED PATIENT TO REMIND OF FU APPT. FOR 06-07-21 @ 3 PM, LVM FOR A RETURN CALL ?

## 2021-06-07 NOTE — Progress Notes (Signed)
[name] is here today for follow up post radiation to the lung. ? ?Lung Side: Right, patient completed treatment on 12/11/16. ? ?Does the patient complain of any of the following: ?Pain:No ?Shortness of breath w/wo exertion: No ?Cough: No ?Hemoptysis: No ?Pain with swallowing: No ?Swallowing/choking concerns: No ?Appetite: Good ?Weight change: -  ?Wt Readings from Last 3 Encounters:  ?06/07/21 98 lb 12.8 oz (44.8 kg)  ?11/29/20 96 lb 6 oz (43.7 kg)  ?05/13/20 95 lb 3.2 oz (43.2 kg)  ?  ?Energy Level: Good ?Post radiation skin Changes: No ? ? ? ?Additional comments if applicable:  ? ?Vitals:  ? 06/07/21 1504  ?BP: (!) 190/70  ?Pulse: (!) 52  ?Resp: 20  ?Temp: 97.6 ?F (36.4 ?C)  ?SpO2: 98%  ?Weight: 98 lb 12.8 oz (44.8 kg)  ?Height: 5' (1.524 m)  ?  ?

## 2021-08-04 ENCOUNTER — Other Ambulatory Visit: Payer: Self-pay | Admitting: *Deleted

## 2021-08-04 DIAGNOSIS — R0989 Other specified symptoms and signs involving the circulatory and respiratory systems: Secondary | ICD-10-CM

## 2021-08-31 ENCOUNTER — Encounter (HOSPITAL_COMMUNITY): Payer: Medicare Other

## 2021-08-31 ENCOUNTER — Ambulatory Visit: Payer: Medicare Other

## 2021-10-05 ENCOUNTER — Ambulatory Visit (HOSPITAL_COMMUNITY)
Admission: RE | Admit: 2021-10-05 | Discharge: 2021-10-05 | Disposition: A | Discharge status: Home / Self Care | Payer: Medicare Other | Source: Ambulatory Visit | Attending: Vascular Surgery | Admitting: Vascular Surgery

## 2021-10-05 ENCOUNTER — Ambulatory Visit (INDEPENDENT_AMBULATORY_CARE_PROVIDER_SITE_OTHER): Payer: Medicare Other | Admitting: Physician Assistant

## 2021-10-05 VITALS — BP 172/72 | HR 71 | Temp 97.8°F | Resp 20 | Ht 60.0 in | Wt 97.9 lb

## 2021-10-05 DIAGNOSIS — I6523 Occlusion and stenosis of bilateral carotid arteries: Secondary | ICD-10-CM | POA: Diagnosis not present

## 2021-10-05 DIAGNOSIS — R0989 Other specified symptoms and signs involving the circulatory and respiratory systems: Secondary | ICD-10-CM | POA: Insufficient documentation

## 2021-10-05 NOTE — Progress Notes (Signed)
Office Note     CC:  follow up Requesting Provider:  Shirline Frees, MD  HPI: [name] is a 88 y.o. (1929-10-28) female who presents for surveillance of carotid artery stenosis.  She denies any diagnosis of TIA or CVA since last office visit.  A year ago she was noted to have 40 to 59% of her left ICA by carotid duplex.  She denies any strokelike symptoms including slurring speech, changes in vision, or one-sided weakness.  She is on a daily statin.  She denies tobacco use.  She denies any trouble walking or rest pain or tissue loss of bilateral lower extremities.   Past Medical History:  Diagnosis Date   Anxiety    Anxiety disorder    Arthritis    Carotid stenosis    Complication of anesthesia    COPD (chronic obstructive pulmonary disease) (HCC)    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   Esophageal stricture    GERD (gastroesophageal reflux disease)    History of gastritis    History of phlebitis    History of radiation therapy 11/30/16-12/11/16   SBRT to right lung 50 Gy in 5 fractions   Hypertension    Lung cancer ([place]) dx'd 08/2016   Lung nodule    Right Upper   Osteoporosis    Pneumonia    PONV (postoperative nausea and vomiting)    UTI (urinary tract infection)     Past Surgical History:  Procedure Laterality Date   BREAST CYST EXCISION     left   CATARACT EXTRACTION     bilateral   CYSTECTOMY     Left knee   EYE SURGERY     KNEE ARTHROSCOPY     right/ Pt can not recall   VIDEO BRONCHOSCOPY WITH ENDOBRONCHIAL NAVIGATION N/A 10/25/2016   Procedure: VIDEO BRONCHOSCOPY WITH ENDOBRONCHIAL NAVIGATION with biopsies;  Surgeon: [name], MD;  Location: [facility] OR;  Service: Thoracic;  Laterality: N/A;    Social History   Socioeconomic History   Marital status: Widowed    Spouse name: Not on file   Number of children: 0   Years of education: Not on file   Highest education level: Not on file  Occupational History   Occupation: retired   Tobacco Use   Smoking status: Never    Passive exposure: Yes   Smokeless tobacco: Never   Tobacco comments:    Exposed at work 28 yrs.   Vaping Use   Vaping Use: Never used  Substance and Sexual Activity   Alcohol use: Yes    Comment: occasional   Drug use: No   Sexual activity: Not on file  Other Topics Concern   Not on file  Social History Narrative   Not on file   Social Determinants of Health   Financial Resource Strain: Not on file  Food Insecurity: Not on file  Transportation Needs: Not on file  Physical Activity: Not on file  Stress: Not on file  Social Connections: Not on file  Intimate Partner Violence: Not on file    Family History  Problem Relation Age of Onset   Breast cancer Mother    Breast cancer Sister        x 2   Uterine cancer Other    Breast cancer Sister    Colon cancer Neg Hx     Current Outpatient Medications  Medication Sig Dispense Refill   acetaminophen (TYLENOL) 500 MG tablet Take 500 mg by mouth every  8 (eight) hours as needed for mild pain or headache.     amLODipine (NORVASC) 10 MG tablet Take 10 mg by mouth daily.     AMLODIPINE BESY-BENAZEPRIL HCL PO Take by mouth.     Ascorbic Acid (VITAMIN C) 500 MG CAPS Take by mouth.     atenolol (TENORMIN) 25 MG tablet Take 25 mg by mouth daily.      CRANBERRY PO Take 4,200 mg by mouth daily.     denosumab (PROLIA) 60 MG/ML SOLN injection Inject 60 mg into the skin every 6 (six) months. Administer in upper arm, thigh, or abdomen  Next dose is 04-2017     losartan (COZAAR) 50 MG tablet Take 50 mg by mouth daily.     omeprazole (PRILOSEC) 40 MG capsule Take 40 mg by mouth daily before breakfast.      pravastatin (PRAVACHOL) 20 MG tablet Take 20 mg by mouth at bedtime.      pyridOXINE (VITAMIN B-6) 100 MG tablet Take 100 mg by mouth daily.     sertraline (ZOLOFT) 50 MG tablet Take 25 mg by mouth 2 (two) times daily.     triamcinolone cream (KENALOG) 0.1 %      vitamin E 400 UNIT capsule Take  400 Units by mouth daily.     No current facility-administered medications for this visit.    Allergies  Allergen Reactions   Elemental Sulfur Other (See Comments)    Severe headache and blurry vision   Propoxyphene N-Acetaminophen Other (See Comments)    Unknown    Sulfonamide Derivatives Other (See Comments)    headaches     REVIEW OF SYSTEMS:   [X]  denotes positive finding, [ ]  denotes negative finding Cardiac  Comments:  Chest pain or chest pressure:    Shortness of breath upon exertion:    Short of breath when lying flat:    Irregular heart rhythm:        Vascular    Pain in calf, thigh, or hip brought on by ambulation:    Pain in feet at night that wakes you up from your sleep:     Blood clot in your veins:    Leg swelling:         Pulmonary    Oxygen at home:    Productive cough:     Wheezing:         Neurologic    Sudden weakness in arms or legs:     Sudden numbness in arms or legs:     Sudden onset of difficulty speaking or slurred speech:    Temporary loss of vision in one eye:     Problems with dizziness:         Gastrointestinal    Blood in stool:     Vomited blood:         Genitourinary    Burning when urinating:     Blood in urine:        Psychiatric    Major depression:         Hematologic    Bleeding problems:    Problems with blood clotting too easily:        Skin    Rashes or ulcers:        Constitutional    Fever or chills:      PHYSICAL EXAMINATION:  Vitals:   10/05/21 1026 10/05/21 1044  BP: (!) 180/77 (!) 172/72  Pulse: 71   Resp: 20   Temp: 97.8 F (36.6 C)  TempSrc: Temporal   SpO2: 95%   Weight: 97 lb 14.4 oz (44.4 kg)   Height: 5' (1.524 m)     General:  WDWN in NAD; vital signs documented above Gait: Not observed HENT: WNL, normocephalic Pulmonary: normal non-labored breathing , without Rales, rhonchi,  wheezing Cardiac: regular HR Abdomen: soft, NT, no masses Skin: without rashes Vascular  Exam/Pulses:  Right Left  Radial 2+ (normal) 2+ (normal)   Extremities: without ischemic changes, without Gangrene , without cellulitis; without open wounds;  Musculoskeletal: no muscle wasting or atrophy  Neurologic: A&O X 3;  CN grossly intact Psychiatric:  The pt has Normal affect.   Non-Invasive Vascular Imaging:   Right ICA 1 to 39% stenosis Left ICA 40 to 59% stenosis    ASSESSMENT/PLAN:: 88 y.o. female here for follow up for carotid artery stenosis surveillance   -Subjectively the patient denies any neurological events including slurring speech, changes in vision, or one-sided weakness since last office visit -Carotid duplex is stable demonstrating 1 to 39% stenosis of the right ICA and 40 to 59% stenosis of the left ICA.  No indication for revascularization of asymptomatic left ICA stenosis. -Continue statin daily -Recheck carotid duplex in 1 year   Dagoberto Ligas, PA-C [facility] [phone]  Clinic MD:   Donzetta Matters

## 2021-10-07 ENCOUNTER — Other Ambulatory Visit: Payer: Self-pay

## 2021-10-07 DIAGNOSIS — I6523 Occlusion and stenosis of bilateral carotid arteries: Secondary | ICD-10-CM

## 2021-10-26 ENCOUNTER — Telehealth: Payer: Self-pay | Admitting: *Deleted

## 2021-10-26 NOTE — Telephone Encounter (Signed)
CALLED PATIENT TO INFORM OF CT FOR 12-08-21- ARRIVAL TIME- 1:30 PM @ [facility], NO RESTRICTIONS TO TEST, PATIENT TO RECEIVE RESULTS FROM DR. KINARD ON  12-12-21 @ 3 PM FOR RESULTS, SPOKE WITH PATIENT AND SHE IS AWARE OF THESE APPTS. AND THE INSTRUCTIONS

## 2021-12-08 ENCOUNTER — Telehealth: Payer: Self-pay | Admitting: *Deleted

## 2021-12-08 ENCOUNTER — Ambulatory Visit (HOSPITAL_COMMUNITY)
Admission: RE | Admit: 2021-12-08 | Discharge: 2021-12-08 | Disposition: A | Discharge status: Home / Self Care | Payer: Medicare Other | Source: Ambulatory Visit | Attending: Radiation Oncology | Admitting: Radiation Oncology

## 2021-12-08 ENCOUNTER — Encounter (HOSPITAL_COMMUNITY): Payer: Self-pay

## 2021-12-08 DIAGNOSIS — C3411 Malignant neoplasm of upper lobe, right bronchus or lung: Secondary | ICD-10-CM | POA: Diagnosis present

## 2021-12-08 NOTE — Telephone Encounter (Signed)
RETURNED PATIENT'S PHONE CALL, LVM FOR A RETURN CALL 

## 2021-12-11 NOTE — Progress Notes (Signed)
Radiation Oncology         (336) [phone] ________________________________  Name: [name] MRN: [id number]  Date: 12/12/2021  DOB: Jun 30, 1929  Follow-Up Visit Note  CC: Shirline Frees, MD  Modesto Charon C, *  No diagnosis found.  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  Her most recent chest CT on 12/08/21 showed stable findings with no new or progressive findings to suggest recurrent or metastatic disease.       Otherwise, no significant oncologic interval history since the patient was last seen for follow-up.     ***                        Allergies:  is allergic to elemental sulfur, propoxyphene n-acetaminophen, and sulfonamide derivatives.  Meds: Current Outpatient Medications  Medication Sig Dispense Refill   acetaminophen (TYLENOL) 500 MG tablet Take 500 mg by mouth every 8 (eight) hours as needed for mild pain or headache.     amLODipine (NORVASC) 10 MG tablet Take 10 mg by mouth daily.     AMLODIPINE BESY-BENAZEPRIL HCL PO Take by mouth.     Ascorbic Acid (VITAMIN C) 500 MG CAPS Take by mouth.     atenolol (TENORMIN) 25 MG tablet Take 25 mg by mouth daily.      CRANBERRY PO Take 4,200 mg by mouth daily.     denosumab (PROLIA) 60 MG/ML SOLN injection Inject 60 mg into the skin every 6 (six) months. Administer in upper arm, thigh, or abdomen  Next dose is 04-2017     losartan (COZAAR) 50 MG tablet Take 50 mg by mouth daily.     omeprazole (PRILOSEC) 40 MG capsule Take 40 mg by mouth daily before breakfast.      pravastatin (PRAVACHOL) 20 MG tablet Take 20 mg by mouth at bedtime.      pyridOXINE (VITAMIN B-6) 100 MG tablet Take 100 mg by  mouth daily.     sertraline (ZOLOFT) 50 MG tablet Take 25 mg by mouth 2 (two) times daily.     triamcinolone cream (KENALOG) 0.1 %      vitamin E 400 UNIT capsule Take 400 Units by mouth daily.     No current facility-administered medications for this encounter.    Physical Findings: The patient is in no acute distress. Patient is alert and oriented.  vitals were not taken for this visit. .  No significant changes. Lungs are clear to auscultation bilaterally. Heart has regular rate and rhythm. No palpable cervical, supraclavicular, or axillary adenopathy. Abdomen soft, non-tender, normal bowel sounds.   Lab Findings: Lab Results  Component Value Date   WBC 6.1 10/25/2016   HGB 12.2 10/25/2016   HCT 35.5 (L) 10/25/2016   MCV 87.4 10/25/2016   PLT 183 10/25/2016    Radiographic Findings: CT CHEST WO CONTRAST  Result Date: 12/11/2021 CLINICAL DATA:  Non-small-cell lung cancer.  Restaging. EXAM: CT CHEST WITHOUT CONTRAST TECHNIQUE: Multidetector CT imaging of the chest was performed following the standard protocol without IV contrast. RADIATION DOSE REDUCTION: This exam was performed according to the departmental dose-optimization program which includes automated exposure control, adjustment  of the mA and/or kV according to patient size and/or use of iterative reconstruction technique. COMPARISON:  05/27/2021 FINDINGS: Cardiovascular: The heart size is normal. No substantial pericardial effusion. Coronary artery calcification is evident. Mild atherosclerotic calcification is noted in the wall of the thoracic aorta. Mediastinum/Nodes: No mediastinal lymphadenopathy. 8 mm precarinal node measured previously is stable in the interval. No evidence for gross hilar lymphadenopathy although assessment is limited by the lack of intravenous contrast on the current study. The esophagus has normal imaging features. There is no axillary lymphadenopathy. Lungs/Pleura: Stable appearance of volume loss and  scarring in the medial right upper lobe. 2 mm anterior right upper lobe nodule on 73/5 is stable. The 5 mm posterior left lower lobe nodule is unchanged. No new suspicious nodule or mass. No focal airspace consolidation. There is no evidence of pleural effusion. Upper Abdomen: Atrophic right kidney. Musculoskeletal: No worrisome lytic or sclerotic osseous abnormality. IMPRESSION: 1. Stable exam. No new or progressive findings to suggest recurrent or metastatic disease. 2. Stable appearance of volume loss and scarring in the medial right upper lobe. 3. Stable tiny bilateral pulmonary nodules, consistent with benign etiology. 4.  Aortic Atherosclerosis (ICD10-I70.0). Electronically Signed   By: Misty Stanley M.D.   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  ***  Plan:  ***   *** minutes of total time was spent for this patient encounter, including preparation, face-to-face counseling with the patient and coordination of care, physical exam, and documentation of the encounter. ____________________________________  Blair Promise, PhD, MD  This document serves as a record of services personally performed by Gery Pray, MD. It was created on his behalf by Roney Mans, a trained medical scribe. The creation of this record is based on the scribe's personal observations and the provider's statements to them. This document has been checked and approved by the attending provider.

## 2021-12-12 ENCOUNTER — Ambulatory Visit
Admission: RE | Admit: 2021-12-12 | Discharge: 2021-12-12 | Disposition: A | Discharge status: Home / Self Care | Payer: Medicare Other | Source: Ambulatory Visit | Attending: Radiation Oncology | Admitting: Radiation Oncology

## 2021-12-12 ENCOUNTER — Encounter: Payer: Self-pay | Admitting: Radiation Oncology

## 2021-12-12 DIAGNOSIS — Z79899 Other long term (current) drug therapy: Secondary | ICD-10-CM | POA: Diagnosis not present

## 2021-12-12 DIAGNOSIS — Z85118 Personal history of other malignant neoplasm of bronchus and lung: Secondary | ICD-10-CM | POA: Diagnosis present

## 2021-12-12 DIAGNOSIS — Z923 Personal history of irradiation: Secondary | ICD-10-CM | POA: Insufficient documentation

## 2021-12-12 DIAGNOSIS — J984 Other disorders of lung: Secondary | ICD-10-CM | POA: Diagnosis not present

## 2021-12-12 DIAGNOSIS — N261 Atrophy of kidney (terminal): Secondary | ICD-10-CM | POA: Insufficient documentation

## 2021-12-12 DIAGNOSIS — C3411 Malignant neoplasm of upper lobe, right bronchus or lung: Secondary | ICD-10-CM

## 2021-12-12 NOTE — Progress Notes (Signed)
[name] is here today for follow up post radiation to the lung.  Lung Side:  Right lung, 12/11/17  Does the patient complain of any of the following: Pain:No Shortness of breath w/wo exertion: Yes on exertion.  Cough: Yes, dry.  Hemoptysis: No Pain with swallowing: No Swallowing/choking concerns: No Appetite: Good Weight:  Wt Readings from Last 3 Encounters:  12/12/21 100 lb 6.4 oz (45.5 kg)  10/05/21 97 lb 14.4 oz (44.4 kg)  06/07/21 98 lb 12.8 oz (44.8 kg)   Energy Level: Good  Post radiation skin Changes: No     Additional comments if applicable:   BP (!) 016/01 (BP Location: Left Arm, Patient Position: Sitting, Cuff Size: Normal)   Pulse 71   Temp (!) 97.4 F (36.3 C)   Resp 20   Ht 5' (1.524 m)   Wt 100 lb 6.4 oz (45.5 kg)   SpO2 98%   BMI 19.61 kg/m

## 2022-04-10 ENCOUNTER — Other Ambulatory Visit: Payer: Self-pay | Admitting: Family Medicine

## 2022-04-10 DIAGNOSIS — M810 Age-related osteoporosis without current pathological fracture: Secondary | ICD-10-CM

## 2022-04-16 ENCOUNTER — Other Ambulatory Visit: Payer: Self-pay

## 2022-04-16 ENCOUNTER — Encounter (HOSPITAL_BASED_OUTPATIENT_CLINIC_OR_DEPARTMENT_OTHER): Payer: Self-pay | Admitting: Emergency Medicine

## 2022-04-16 ENCOUNTER — Emergency Department (HOSPITAL_BASED_OUTPATIENT_CLINIC_OR_DEPARTMENT_OTHER): Payer: Medicare Other

## 2022-04-16 ENCOUNTER — Emergency Department (HOSPITAL_BASED_OUTPATIENT_CLINIC_OR_DEPARTMENT_OTHER)
Admission: EM | Admit: 2022-04-16 | Discharge: 2022-04-17 | Disposition: A | Discharge status: Home / Self Care | Payer: Medicare Other | Attending: Emergency Medicine | Admitting: Emergency Medicine

## 2022-04-16 DIAGNOSIS — Y92481 Parking lot as the place of occurrence of the external cause: Secondary | ICD-10-CM | POA: Insufficient documentation

## 2022-04-16 DIAGNOSIS — S0990XA Unspecified injury of head, initial encounter: Secondary | ICD-10-CM | POA: Diagnosis present

## 2022-04-16 DIAGNOSIS — S0083XA Contusion of other part of head, initial encounter: Secondary | ICD-10-CM | POA: Insufficient documentation

## 2022-04-16 DIAGNOSIS — W19XXXA Unspecified fall, initial encounter: Secondary | ICD-10-CM | POA: Insufficient documentation

## 2022-04-16 NOTE — ED Triage Notes (Signed)
Pt here for mechanical fall this evening. Pt tripped in the parking lot, hit her head on the pavement. No LOC. Pt has head pain, denies pain/injuries anywhere else. Pt not on blood thinners.

## 2022-04-17 NOTE — Discharge Instructions (Signed)
You were seen in the emergency department following a fall. Thankfully your head CT was reassuring without signs of any fractures or intracranial bleeds. Please manage symptoms at home as best you can with over the counter pain medications with Tylenol or ibuprofen as needed. Try to reduce trip hazards at home as best as possible. Otherwise, plan to follow up with your primary care provider in the next week or so for further evaluation. If you begin to experience a severe headache, confusion, nausea, vomiting, please return to the emergency department for further evaluation.

## 2022-04-18 NOTE — ED Provider Notes (Cosign Needed)
[facility] Provider Note   CSN: [id number] Arrival date & time: 04/16/22  2012     History Chief Complaint  Patient presents with   [name]    [name] is a 89 y.o. female.  Patient presents emergency department following a mechanical fall.  She reports that she fell in a parking lot and hit her head into the pavement.  Denies any loss of consciousness but does report that she is having a bit of head pain at the area that was struck.  Denies being on any blood thinners at this time.  Denies associated nausea, vomiting, abdominal pain, chest pain, shortness of breath.   Fall       Home Medications Prior to Admission medications   Medication Sig Start Date End Date Taking? Authorizing Provider  acetaminophen (TYLENOL) 500 MG tablet Take 500 mg by mouth every 8 (eight) hours as needed for mild pain or headache.    [provider]  amLODipine (NORVASC) 10 MG tablet Take 10 mg by mouth daily.    [provider]  AMLODIPINE BESY-BENAZEPRIL HCL PO Take by mouth.    [provider]  Ascorbic Acid (VITAMIN C) 500 MG CAPS Take by mouth.    [provider]  atenolol (TENORMIN) 25 MG tablet Take 25 mg by mouth daily.     [provider]  CRANBERRY PO Take 4,200 mg by mouth daily.    [provider]  denosumab (PROLIA) 60 MG/ML SOLN injection Inject 60 mg into the skin every 6 (six) months. Administer in upper arm, thigh, or abdomen  Next dose is 04-2017    [provider]  losartan (COZAAR) 50 MG tablet Take 50 mg by mouth daily.    [provider]  omeprazole (PRILOSEC) 40 MG capsule Take 40 mg by mouth daily before breakfast.     [provider]  pravastatin (PRAVACHOL) 20 MG tablet Take 20 mg by mouth at bedtime.  07/02/15   [provider]  pyridOXINE (VITAMIN B-6) 100 MG tablet Take 100 mg by mouth daily.    [provider]  sertraline  (ZOLOFT) 50 MG tablet Take 25 mg by mouth 2 (two) times daily.    [provider]  triamcinolone cream (KENALOG) 0.1 %  04/04/19   [provider]  vitamin E 400 UNIT capsule Take 400 Units by mouth daily.    [provider]      Allergies    Elemental sulfur, Propoxyphene n-acetaminophen, and Sulfonamide derivatives    Review of Systems   Review of Systems  Skin:  Positive for wound.  All other systems reviewed and are negative.   Physical Exam Updated Vital Signs BP (!) 140/81   Pulse 75   Temp 97.7 F (36.5 C) (Oral)   Resp 18   SpO2 98%  Physical Exam Vitals and nursing note reviewed.  Constitutional:      General: She is not in acute distress.    Appearance: Normal appearance. She is well-developed.  HENT:     Head: Normocephalic and atraumatic.  Eyes:     Conjunctiva/sclera: Conjunctivae normal.  Cardiovascular:     Rate and Rhythm: Normal rate and regular rhythm.     Heart sounds: No murmur heard. Pulmonary:     Effort: Pulmonary effort is normal. No respiratory distress.     Breath sounds: Normal breath sounds.  Abdominal:     Palpations: Abdomen is soft.  Tenderness: There is no abdominal tenderness.  Musculoskeletal:        General: No swelling.     Cervical back: Neck supple.  Skin:    General: Skin is warm and dry.     Capillary Refill: Capillary refill takes less than 2 seconds.     Findings: Bruising present.     Comments: Bruising over the left supraorbital region noted without significant hematoma formation  Neurological:     Mental Status: She is alert.     Comments: CN II-XII intact  Psychiatric:        Mood and Affect: Mood normal.     ED Results / Procedures / Treatments   Labs (all labs ordered are listed, but only abnormal results are displayed) Labs Reviewed - No data to display  EKG None  Radiology No results found.  Procedures Procedures   Medications Ordered in ED Medications - No data to  display  ED Course/ Medical Decision Making/ A&P                           Medical Decision Making Amount and/or Complexity of Data Reviewed Radiology: ordered.   This patient presents to the ED for concern of fall.  Differential diagnosis includes internal bleeding, concussion, hematoma, mechanical fall   Imaging Studies ordered:  I ordered imaging studies including CT head I independently visualized and interpreted imaging which showed no acute cranial abnormalities I agree with the radiologist interpretation   Problem List / ED Course:  Patient presents emergency department following a mechanical fall.  She reports that she fell in a parking lot her head against pavement.  Denies any loss of consciousness but is reporting some head pain since the fall.  Denies any pain anywhere else in her body.  Patient is not currently on any blood thinners and denies that she had any significant bleeding following this injury.  There is some notable bruising and minor swelling over the left supraorbital region.  Head CT was performed which is thankfully reassuring without any signs of any acute intracranial abnormalities.  Informed patient of these findings and advised her to manage symptoms at home as best that she can with Tylenol ibuprofen.  Also encourage patient to monitor for symptoms if they change and become significantly worse and that she should return the emergency department for further evaluation.  Patient agrees with treatment plan verbalized understanding return precautions.  All questions answered prior to patient discharge.   Final Clinical Impression(s) / ED Diagnoses Final diagnoses:  Fall, initial encounter  Contusion of face, initial encounter    Rx / DC Orders ED Discharge Orders     None         Luvenia Heller, PA-C 04/18/22 2359    Luvenia Heller, PA-C 04/19/22 0004    Tegeler, Gwenyth Allegra, MD 04/19/22 [phone]

## 2022-10-10 ENCOUNTER — Other Ambulatory Visit: Payer: Self-pay | Admitting: *Deleted

## 2022-10-10 DIAGNOSIS — I6523 Occlusion and stenosis of bilateral carotid arteries: Secondary | ICD-10-CM

## 2022-10-17 ENCOUNTER — Telehealth: Payer: Self-pay | Admitting: *Deleted

## 2022-10-17 NOTE — Telephone Encounter (Signed)
CALLED PATIENT TO INFORM OF CT FOR 12-04-22- ARRIVAL TIME- 12 PM @ [facility], NO RESTRICTIONS TO TEST, PATIENT TO RECEIVE RESULTS FROM DR.KINARD ON 12-07-22 @ 11:45 AM, SPOKE WITH PATIENT AND SHE IS AWARE OF THESE APPTS. AND THE INSTRUCTIONS

## 2022-10-18 ENCOUNTER — Ambulatory Visit (HOSPITAL_COMMUNITY)
Admission: RE | Admit: 2022-10-18 | Discharge: 2022-10-18 | Disposition: A | Discharge status: Home / Self Care | Payer: Medicare Other | Source: Ambulatory Visit | Attending: Vascular Surgery | Admitting: Vascular Surgery

## 2022-10-18 ENCOUNTER — Ambulatory Visit: Payer: Medicare Other | Admitting: Physician Assistant

## 2022-10-18 VITALS — BP 181/79 | HR 84 | Temp 98.0°F | Resp 18 | Ht 60.0 in | Wt 96.8 lb

## 2022-10-18 DIAGNOSIS — I6523 Occlusion and stenosis of bilateral carotid arteries: Secondary | ICD-10-CM

## 2022-10-18 NOTE — Progress Notes (Signed)
Office Note   History of Present Illness   [name] is a 89 y.o. ([birth year]) female who presents for surveillance of carotid artery stenosis.  She has a history of left ICA 40 to 59% stenosis.  She has no prior carotid interventions.  The patient returns today for follow up. She denies any recent CVA or TIA diagnosis. She also denies any recent strokelike symptoms such as slurred speech, facial droop, sudden visual changes, or sudden weakness/numbness.   Current Outpatient Medications  Medication Sig Dispense Refill   acetaminophen (TYLENOL) 500 MG tablet Take 500 mg by mouth every 8 (eight) hours as needed for mild pain or headache.     amLODipine (NORVASC) 10 MG tablet Take 10 mg by mouth daily.     AMLODIPINE BESY-BENAZEPRIL HCL PO Take by mouth.     Ascorbic Acid (VITAMIN C) 500 MG CAPS Take by mouth.     atenolol (TENORMIN) 25 MG tablet Take 25 mg by mouth daily.      CRANBERRY PO Take 4,200 mg by mouth daily.     denosumab (PROLIA) 60 MG/ML SOLN injection Inject 60 mg into the skin every 6 (six) months. Administer in upper arm, thigh, or abdomen  Next dose is 04-2017     losartan (COZAAR) 50 MG tablet Take 50 mg by mouth daily.     omeprazole (PRILOSEC) 40 MG capsule Take 40 mg by mouth daily before breakfast.      pravastatin (PRAVACHOL) 20 MG tablet Take 20 mg by mouth at bedtime.      pyridOXINE (VITAMIN B-6) 100 MG tablet Take 100 mg by mouth daily.     sertraline (ZOLOFT) 50 MG tablet Take 25 mg by mouth 2 (two) times daily.     triamcinolone cream (KENALOG) 0.1 %      vitamin E 400 UNIT capsule Take 400 Units by mouth daily.     No current facility-administered medications for this visit.    REVIEW OF SYSTEMS (negative unless checked):   Cardiac:  []  Chest pain or chest pressure? []  Shortness of breath upon activity? []  Shortness of breath when lying flat? []  Irregular heart rhythm?  Vascular:  []  Pain in calf, thigh, or hip brought on by walking? []   Pain in feet at night that wakes you up from your sleep? []  Blood clot in your veins? []  Leg swelling?  Pulmonary:  []  Oxygen at home? []  Productive cough? []  Wheezing?  Neurologic:  []  Sudden weakness in arms or legs? []  Sudden numbness in arms or legs? []  Sudden onset of difficult speaking or slurred speech? []  Temporary loss of vision in one eye? []  Problems with dizziness?  Gastrointestinal:  []  Blood in stool? []  Vomited blood?  Genitourinary:  []  Burning when urinating? []  Blood in urine?  Psychiatric:  []  Major depression  Hematologic:  []  Bleeding problems? []  Problems with blood clotting?  Dermatologic:  []  Rashes or ulcers?  Constitutional:  []  Fever or chills?  Ear/Nose/Throat:  []  Change in hearing? []  Nose bleeds? []  Sore throat?  Musculoskeletal:  []  Back pain? []  Joint pain? []  Muscle pain?   Physical Examination   Vitals:   10/18/22 0956 10/18/22 0957  BP: (!) 162/74 (!) 181/79  Pulse: 84   Resp: 18   Temp: 98 F (36.7 C)   TempSrc: Temporal   SpO2: 97%   Weight: 96 lb 12.8 oz (43.9 kg)   Height: 5' (1.524 m)    Body mass index is 18.9  kg/m.  General:  WDWN in NAD; vital signs documented above Gait: Not observed HENT: WNL, normocephalic Pulmonary: normal non-labored breathing , without rales, rhonchi,  wheezing Cardiac: regular, without carotid bruit Abdomen: soft, NT, no masses Skin: without rashes Vascular Exam/Pulses: palpable radial pulses bilaterally Extremities: without ischemic changes, without gangrene , without cellulitis; without open wounds;  Musculoskeletal: no muscle wasting or atrophy  Neurologic: A&O X 3;  No focal weakness or paresthesias are detected Psychiatric:  The pt has Normal affect.  Non-Invasive Vascular Imaging   Bilateral Carotid Duplex (10/18/2022):  R ICA stenosis:  1-39% R VA:  patent and antegrade L ICA stenosis:  40-59% L VA: patent and antegrade   Medical Decision Making   [name] is a 89 y.o. female who presents for surveillance of carotid artery stenosis  Based on the patient's vascular studies, their carotid artery stenosis is unchanged bilaterally.  Right ICA stenosis is 1 to 39% and left ICA stenosis is 40 to 59% She denies any strokelike symptoms such as slurred speech, facial droop, sudden visual changes, or sudden weakness/numbness.  She has no carotid bruits on exam.  She has palpable and equal radial pulses bilaterally She can follow-up with our office in 1 year with repeat carotid duplex   Loel Dubonnet PA-C [facility] Office: [phone]  Clinic MD: Randie Heinz

## 2022-10-20 ENCOUNTER — Ambulatory Visit
Admission: RE | Admit: 2022-10-20 | Discharge: 2022-10-20 | Disposition: A | Discharge status: Home / Self Care | Payer: Medicare Other | Source: Ambulatory Visit | Attending: Family Medicine | Admitting: Family Medicine

## 2022-10-20 ENCOUNTER — Other Ambulatory Visit: Payer: Self-pay

## 2022-10-20 DIAGNOSIS — I6523 Occlusion and stenosis of bilateral carotid arteries: Secondary | ICD-10-CM

## 2022-10-20 DIAGNOSIS — M810 Age-related osteoporosis without current pathological fracture: Secondary | ICD-10-CM

## 2022-12-04 ENCOUNTER — Ambulatory Visit (HOSPITAL_COMMUNITY)
Admission: RE | Admit: 2022-12-04 | Discharge: 2022-12-04 | Disposition: A | Discharge status: Home / Self Care | Payer: Medicare Other | Source: Ambulatory Visit | Attending: Radiation Oncology | Admitting: Radiation Oncology

## 2022-12-04 DIAGNOSIS — C3411 Malignant neoplasm of upper lobe, right bronchus or lung: Secondary | ICD-10-CM | POA: Diagnosis present

## 2022-12-07 ENCOUNTER — Ambulatory Visit
Admission: RE | Admit: 2022-12-07 | Discharge: 2022-12-07 | Disposition: A | Discharge status: Home / Self Care | Payer: Medicare Other | Source: Ambulatory Visit | Attending: Radiation Oncology | Admitting: Radiation Oncology

## 2022-12-07 DIAGNOSIS — C3411 Malignant neoplasm of upper lobe, right bronchus or lung: Secondary | ICD-10-CM

## 2022-12-18 ENCOUNTER — Encounter: Payer: Self-pay | Admitting: Radiation Oncology

## 2022-12-18 ENCOUNTER — Ambulatory Visit
Admission: RE | Admit: 2022-12-18 | Discharge: 2022-12-18 | Disposition: A | Discharge status: Home / Self Care | Payer: Medicare Other | Source: Ambulatory Visit | Attending: Radiation Oncology | Admitting: Radiation Oncology

## 2022-12-18 VITALS — BP 163/63 | HR 77 | Temp 96.8°F | Resp 18 | Ht 60.0 in | Wt 95.0 lb

## 2022-12-18 DIAGNOSIS — C3411 Malignant neoplasm of upper lobe, right bronchus or lung: Secondary | ICD-10-CM

## 2022-12-18 NOTE — Progress Notes (Signed)
[name] is here today for follow up post radiation to the lung.  Lung Side: Right  Does the patient complain of any of the following: Pain:No Shortness of breath w/wo exertion: No Cough: No Hemoptysis: No Pain with swallowing: No Swallowing/choking concerns: No Appetite: Good Energy Level: Good Post radiation skin Changes: No     BP (!) 163/63 (BP Location: Left Arm, Patient Position: Sitting)   Pulse 77   Temp (!) 96.8 F (36 C) (Temporal)   Resp 18   Ht 5' (1.524 m)   Wt 95 lb (43.1 kg)   SpO2 99%   BMI 18.55 kg/m

## 2022-12-18 NOTE — Progress Notes (Signed)
Radiation Oncology         [phone] ________________________________  Name: [name] MRN: [id number]  Date: 12/18/2022  DOB: 10/20/1929  Follow-Up Visit Note  CC: Noberto Retort, MD  Loreli Slot, *    ICD-10-CM   1. 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 She was last seen here for follow-up on 12/12/21.    Her most recent chest CT on 12/04/22 demonstrates stable post treatment changes in the RUL without evidence of recurrent or metastatic disease. There is a new small solid nodule in the RLL measuring 3 mm, which is likely reflective of an infectious etiology vs aspiration. Recommendation is for follow-up imaging in 3-6 months to ensure resolution.    Other pertinent imaging / studies performed in the interval includes a bone density scan on 10/20/22 which classified the patient as osteoporotic.     Of note: she did present to the ED earlier this year on 04/16/22 following a fall. She apparently fell while walking in a parking lot and hit her head on the pavement. Head CT performed was negative for any signs of any acute intracranial abnormalities, and she denied any loss of consciousness. Overall, ED course was unremarkable and she was sent home with instructions to use OTC pain management.    On evaluation today she denies any changes in her breathing.  She denies any significant cough or hemoptysis.  Allergies:  is allergic to elemental sulfur, propoxyphene n-acetaminophen, and sulfonamide derivatives.  Meds: Current Outpatient Medications  Medication Sig Dispense Refill   acetaminophen (TYLENOL)  500 MG tablet Take 500 mg by mouth every 8 (eight) hours as needed for mild pain or headache.     amLODipine (NORVASC) 10 MG tablet Take 10 mg by mouth daily.     AMLODIPINE BESY-BENAZEPRIL HCL PO Take by mouth.     Ascorbic Acid (VITAMIN C) 500 MG CAPS Take by mouth.     atenolol (TENORMIN) 25 MG tablet Take 25 mg by mouth daily.      CRANBERRY PO Take 4,200 mg by mouth daily.     losartan (COZAAR) 50 MG tablet Take 50 mg by mouth daily.     omeprazole (PRILOSEC) 40 MG capsule Take 40 mg by mouth daily before breakfast.      pravastatin (PRAVACHOL) 20 MG tablet Take 20 mg by mouth at bedtime.      pyridOXINE (VITAMIN B-6) 100 MG tablet Take 100 mg by mouth daily.     sertraline (ZOLOFT) 50 MG tablet Take 25 mg by mouth 2 (two) times daily.     triamcinolone cream (KENALOG) 0.1 %      vitamin E 400 UNIT capsule Take 400 Units by mouth daily.     denosumab (PROLIA) 60 MG/ML SOLN injection Inject 60 mg into the skin every 6 (six) months. Administer in upper arm, thigh, or abdomen  Next dose is 04-2017 (Patient not taking: Reported on 12/18/2022)     No current facility-administered medications for this encounter.    Physical Findings: The patient is in no acute distress. Patient is  alert and oriented.  height is 5' (1.524 m) and weight is 95 lb (43.1 kg). Her temporal temperature is 96.8 F (36 C) (abnormal). Her blood pressure is 163/63 (abnormal) and her pulse is 77. Her respiration is 18 and oxygen saturation is 99%. .   Lungs are clear to auscultation bilaterally. Heart has regular rate and rhythm. No palpable cervical, supraclavicular, or axillary adenopathy. Abdomen soft, non-tender, normal bowel sounds.   Lab Findings: Lab Results  Component Value Date   WBC 6.1 10/25/2016   HGB 12.2 10/25/2016   HCT 35.5 (L) 10/25/2016   MCV 87.4 10/25/2016   PLT 183 10/25/2016    Radiographic Findings: CT CHEST WO CONTRAST  Result Date: 12/06/2022 CLINICAL DATA:  Right upper lobe primary  lung neoplasm; * Tracking Code: BO * EXAM: CT CHEST WITHOUT CONTRAST TECHNIQUE: Multidetector CT imaging of the chest was performed following the standard protocol without IV contrast. RADIATION DOSE REDUCTION: This exam was performed according to the departmental dose-optimization program which includes automated exposure control, adjustment of the mA and/or kV according to patient size and/or use of iterative reconstruction technique. COMPARISON:  Multiple priors, most recent chest CT dated December 08, 2021 FINDINGS: Cardiovascular: Normal heart size. No pericardial effusion. Normal caliber thoracic aorta with moderate atherosclerotic disease. Severe coronary artery calcifications. Mediastinum/Nodes: Small hiatal hernia. Thyroid is unremarkable. No enlarged lymph nodes seen in the chest. Lungs/Pleura: Stable postradiation change of the right upper lobe. Mild emphysema. Stable right upper lobe nodule measuring 3 mm on series 6, image 67. New small solid nodule of the right lower lobe measuring 3 mm on series 6, image 109. No pleural effusion. Upper Abdomen: Right renal atrophy. Stable simple appearing cyst of the anterior left kidney. Musculoskeletal: No chest wall mass or suspicious bone lesions identified. IMPRESSION: 1. Stable postradiation change of the right upper lobe. No evidence of recurrent or metastatic disease. 2. New small solid nodule of the right lower lobe measuring 3 mm, likely due to infection or aspiration. Recommend follow-up in 3-6 months to ensure resolution. 3. Coronary artery calcifications, aortic Atherosclerosis (ICD10-I70.0) and Emphysema (ICD10-J43.9). Electronically Signed   By: Allegra Lai M.D.   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 We reviewed her most recent imaging with her. CT of the lungs demonstrates stable postradiation changes in the RUL along with a new small solid  nodule of the RLL, measuring 3 mm.  I recommended we repeat the chest CT scan in 6 months rather than wait a year.  She is agreeable to this recommendation.  Plan:  We will continue to monitor the new nodule on follow-up scans. Follow up chest CT in 6 months. Routine office visit 1-2 days after scan to review results. Patient knows to call with any questions or concerns in the meantime.    23 minutes of total time was spent for this patient encounter, including preparation, face-to-face counseling with the patient and coordination of care, physical exam, and documentation of the encounter. ____________________________________    Billie Lade, PhD, MD  This document serves as a record of services personally performed by Antony Blackbird, MD. It was created on his behalf by Neena Rhymes, a trained medical scribe. The creation of this record is based on the scribe's personal observations and the provider's statements to them. This document has been checked and approved by the attending provider.

## 2023-01-09 ENCOUNTER — Telehealth: Payer: Self-pay | Admitting: *Deleted

## 2023-01-09 NOTE — Telephone Encounter (Signed)
Called patient to inform of CT for 06-15-23- arrival time- 12:15 pm @ [facility],no restrictions to scan, patient to receive results from Dr. Roselind Messier on 06/21/23 @ 11 am, lvm for a return call

## 2023-06-15 ENCOUNTER — Ambulatory Visit (HOSPITAL_COMMUNITY)
Admission: RE | Admit: 2023-06-15 | Discharge: 2023-06-15 | Disposition: A | Discharge status: Home / Self Care | Payer: Medicare Other | Source: Ambulatory Visit | Attending: Radiation Oncology | Admitting: Radiation Oncology

## 2023-06-15 DIAGNOSIS — C3411 Malignant neoplasm of upper lobe, right bronchus or lung: Secondary | ICD-10-CM | POA: Insufficient documentation

## 2023-06-20 NOTE — Progress Notes (Signed)
 Radiation Oncology         [phone] ________________________________  Name: [name] MRN: [id number]  Date: 06/21/2023  DOB: [birth year]  Follow-Up Visit Note  CC: Roselind Congo, MD  Zelphia Higashi, *    ICD-10-CM   1. Primary cancer of right upper lobe of lung (HCC)  C34.11 CT Chest Wo Contrast       Diagnosis:   Stage I (cT1c, N0, M0) Adenocarcinoma, NSCLC, of the upper lobe of the right lung; s/p SBRT completed on 12/11/2016   Interval Since Last Radiation: 6 years 6 months 11 days    11/30/2016 - 12/11/2016:   Right Lung, SBRT/SRT-3D, 6X-FFF, 50 Gy delivered in 5 fractions  Narrative:  The patient returns today for routine follow-up and to review most recent imaging. She was last seen in office on 12/18/22 for a routine follow up. To review, her last CT in November of 2024 demonstrated typical post-radiation changes in the RUL and a new 3 mm nodule in the RLL.     She presented for a follow-up CT chest on 06/15/23 showing stable RUL post treatment changes; the previously visualized 3 mm RLL nodule had resolved; and all other nodules were noted to be stable.   Patient reports to be doing well overall today.  She denies any shortness of breath, cough, or any other changes to her breathing since we last saw her.                              Allergies:  is allergic to elemental sulfur, propoxyphene n-acetaminophen, and sulfonamide derivatives.  Meds: Current Outpatient Medications  Medication Sig Dispense Refill   acetaminophen (TYLENOL) 500 MG tablet Take 500 mg by mouth every 8 (eight) hours as needed for mild pain or headache.     amLODipine (NORVASC) 10 MG tablet Take 10 mg by mouth daily.     AMLODIPINE BESY-BENAZEPRIL HCL PO Take by mouth.     Ascorbic Acid (VITAMIN C) 500 MG CAPS Take by mouth.     atenolol (TENORMIN) 25 MG tablet Take 25 mg by mouth daily.      CRANBERRY PO Take 4,200 mg by mouth daily.     losartan  (COZAAR ) 50 MG tablet Take 50  mg by mouth daily.     omeprazole (PRILOSEC) 40 MG capsule Take 40 mg by mouth daily before breakfast.      pravastatin (PRAVACHOL) 20 MG tablet Take 20 mg by mouth at bedtime.      pyridOXINE (VITAMIN B-6) 100 MG tablet Take 100 mg by mouth daily.     sertraline (ZOLOFT) 50 MG tablet Take 25 mg by mouth 2 (two) times daily.     vitamin E 400 UNIT capsule Take 400 Units by mouth daily.     triamcinolone cream (KENALOG) 0.1 %  (Patient not taking: Reported on 06/21/2023)     No current facility-administered medications for this encounter.    Physical Findings: The patient is in no acute distress. Patient is alert and oriented.  height is 4\' 11"  (1.499 m) and weight is 95 lb 6.4 oz (43.3 kg). Her temperature is 97.2 F (36.2 C) (abnormal). Her blood pressure is 159/59 (abnormal) and her pulse is 79. Her respiration is 18 and oxygen saturation is 100%. .  No significant changes. Lungs are clear to auscultation bilaterally. Heart has regular rate and rhythm. No palpable cervical, supraclavicular, or axillary adenopathy. Abdomen soft, non-tender,  normal bowel sounds.   Lab Findings: Lab Results  Component Value Date   WBC 6.1 10/25/2016   HGB 12.2 10/25/2016   HCT 35.5 (L) 10/25/2016   MCV 87.4 10/25/2016   PLT 183 10/25/2016    Radiographic Findings: CT CHEST WO CONTRAST Result Date: 06/21/2023 CLINICAL DATA:  Restaging lung cancer. * Tracking Code: BO * EXAM: CT CHEST WITHOUT CONTRAST TECHNIQUE: Multidetector CT imaging of the chest was performed following the standard protocol without IV contrast. RADIATION DOSE REDUCTION: This exam was performed according to the departmental dose-optimization program which includes automated exposure control, adjustment of the mA and/or kV according to patient size and/or use of iterative reconstruction technique. COMPARISON:  12/04/2022 FINDINGS: Cardiovascular: Normal heart size. Aortic atherosclerosis. Coronary artery calcifications. No pericardial  effusion. Mediastinum/Nodes: Thyroid gland, trachea, and esophagus appear normal. No mediastinal adenopathy. No axillary or supraclavicular adenopathy. Hilar lymph nodes are suboptimally evaluated due to lack of IV contrast. Lungs/Pleura: Emphysema with diffuse bronchial wall thickening. No pleural effusion, airspace consolidation or interstitial edema. Within the paramediastinal right upper lobe there is masslike architectural distortion with mild surrounding fibrosis which is unchanged in appearance compared with the prior exam, image 39/8. Small nodule within the basilar right upper lobe is unchanged measuring 3 mm, image 74/8. Previous 3 mm nodule in the right base has resolved. Peripheral nodule in the posterior left lower lobe is unchanged measuring 3 mm, image 83/8 No new lung nodules identified. Upper Abdomen: No acute abnormality. Chronic asymmetric right renal atrophy with renal vascular calcifications. Unchanged from previous exam. Musculoskeletal: No chest wall mass or suspicious bone lesions identified. IMPRESSION: 1. Stable appearance of paramediastinal right upper lobe masslike architectural distortion with mild surrounding fibrosis. Imaging findings are compatible with post treatment change. No specific findings to suggest locally recurrent tumor or metastatic disease. 2. Stable small bilateral lung nodules. 3. Coronary artery calcifications. 4. Aortic Atherosclerosis (ICD10-I70.0) and Emphysema (ICD10-J43.9). Electronically Signed   By: Kimberley Penman M.D.   On: 06/21/2023 08:08    Impression: Stage I (cT1c, N0, M0) Adenocarcinoma, NSCLC, of the upper lobe of the right lung; s/p SBRT completed on 12/11/2016  The patient is doing well overall today. We personally reviewed her most recent CT. Imaging demonstrates the previously visualized lung nodule to have resolved, and no other evidence of disease recurrence.  Plan:  Per NCCN guidelines, we will order a follow up chest CT to be completed in  12 months. Routine office visit 1-2 days after scan to review results. Patient was encouraged to call with any questions or concerns in the meantime.    20 minutes of total time was spent for this patient encounter, including preparation, face-to-face counseling with the patient and coordination of care, physical exam, and documentation of the encounter. ____________________________________   Julio Ohm, PA-C   This document serves as a record of services personally performed by Julio Ohm, PA-C. It was created on his behalf by Lucky Sable, a trained medical scribe. The creation of this record is based on the scribe's personal observations and the provider's statements to them. This document has been checked and approved by the attending provider.

## 2023-06-21 ENCOUNTER — Ambulatory Visit
Admission: RE | Admit: 2023-06-21 | Discharge: 2023-06-21 | Disposition: A | Discharge status: Home / Self Care | Payer: Self-pay | Source: Ambulatory Visit | Attending: Radiation Oncology | Admitting: Radiation Oncology

## 2023-06-21 ENCOUNTER — Encounter: Payer: Self-pay | Admitting: Radiation Oncology

## 2023-06-21 VITALS — BP 159/59 | HR 79 | Temp 97.2°F | Resp 18 | Ht 59.0 in | Wt 95.4 lb

## 2023-06-21 DIAGNOSIS — C3411 Malignant neoplasm of upper lobe, right bronchus or lung: Secondary | ICD-10-CM

## 2023-06-21 DIAGNOSIS — Z923 Personal history of irradiation: Secondary | ICD-10-CM | POA: Diagnosis not present

## 2023-06-21 DIAGNOSIS — Z85118 Personal history of other malignant neoplasm of bronchus and lung: Secondary | ICD-10-CM | POA: Diagnosis present

## 2023-06-21 DIAGNOSIS — Z79899 Other long term (current) drug therapy: Secondary | ICD-10-CM | POA: Diagnosis not present

## 2023-06-21 DIAGNOSIS — J439 Emphysema, unspecified: Secondary | ICD-10-CM | POA: Insufficient documentation

## 2023-06-21 DIAGNOSIS — I700 Atherosclerosis of aorta: Secondary | ICD-10-CM | POA: Diagnosis not present

## 2023-06-21 NOTE — Progress Notes (Signed)
 [name] is here today for follow up post radiation to the lung.  Lung Side: Right,patient completed treatment on 12/11/16.  Does the patient complain of any of the following: Pain: No Shortness of breath w/wo exertion: No Cough: No Hemoptysis: No Pain with swallowing: No Swallowing/choking concerns: No Appetite: Good Energy Level: good Post radiation skin Changes: No    Additional comments if applicable:  Patient reports having a skin cancer removed from left leg.   BP (!) 159/59 (BP Location: Left Arm, Patient Position: Sitting, Cuff Size: Normal)   Pulse 79   Temp (!) 97.2 F (36.2 C)   Resp 18   Ht 4\' 11"  (1.499 m)   Wt 95 lb 6.4 oz (43.3 kg)   SpO2 100%   BMI 19.27 kg/m

## 2023-10-13 ENCOUNTER — Ambulatory Visit (HOSPITAL_BASED_OUTPATIENT_CLINIC_OR_DEPARTMENT_OTHER): Admission: EM | Admit: 2023-10-13 | Discharge: 2023-10-13 | Disposition: A | Discharge status: Home / Self Care

## 2023-10-13 ENCOUNTER — Encounter (HOSPITAL_BASED_OUTPATIENT_CLINIC_OR_DEPARTMENT_OTHER): Payer: Self-pay

## 2023-10-13 DIAGNOSIS — R42 Dizziness and giddiness: Secondary | ICD-10-CM

## 2023-10-13 DIAGNOSIS — I10 Essential (primary) hypertension: Secondary | ICD-10-CM

## 2023-10-13 NOTE — ED Triage Notes (Signed)
 Patient presents for assessment of elevated blood pressures for approx 1-1 1/2 weeks. Feeling dizzy and blurred vision.

## 2023-10-13 NOTE — Discharge Instructions (Signed)
 Lets go with the plan that we talked about with management of your blood pressure and dizziness.  If your blood pressure goes over 200 and you are having dizziness or lightheadedness or vision changes you will need to go to the ER. Otherwise make sure you are drinking plenty of fluids, decreasing caffeine and monitoring your blood pressure Please follow-up with your doctor for any continued issues

## 2023-10-14 NOTE — ED Provider Notes (Signed)
 [facility]    CSN: [id number] Arrival date & time: 10/13/23  1456      History   Chief Complaint Chief Complaint  Patient presents with   Dizziness   Hypertension   Blurred Vision    HPI ANNALIA METZGER is a 89 y.o. female.   Pt is a 89 year old female that presents with elevated BP. Hx of similar. Takes multiple BP meds. Patient presents for assessment of elevated blood pressures for approx 1-1 1/2 weeks. Feeling dizzy and blurred vision. Hx of vertigo and anxiety. Takes sertraline.     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   Esophageal stricture    GERD (gastroesophageal reflux disease)    History of gastritis    History of phlebitis    History of radiation therapy 11/30/16-12/11/16   SBRT to right lung 50 Gy in 5 fractions   Hypertension    Lung cancer (HCC) dx'd 08/2016   Lung nodule    Right Upper   Osteoporosis    Pneumonia    PONV (postoperative nausea and vomiting)    UTI (urinary tract infection)     Patient Active Problem List   Diagnosis Date Noted   Primary cancer of right upper lobe of lung (HCC) 11/09/2016   Breast cancer in situ 09/05/2016   Pulmonary infiltrate in right lung on chest x-ray 09/04/2016   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   Anxiety state 05/14/2008   Essential hypertension 05/14/2008   PHLEBITIS 05/14/2008   Other specified chronic obstructive pulmonary disease (HCC) 05/14/2008   ESOPHAGEAL STRICTURE 05/14/2008   GERD 05/14/2008   Arthropathy 05/14/2008   Osteoporosis 05/14/2008   GASTRITIS, HX OF 05/14/2008    Past Surgical History:  Procedure Laterality Date   BREAST CYST EXCISION     left   CATARACT EXTRACTION     bilateral   CYSTECTOMY     Left knee   EYE  SURGERY     KNEE ARTHROSCOPY     right/ Pt can not recall   VIDEO BRONCHOSCOPY WITH ENDOBRONCHIAL NAVIGATION N/A 10/25/2016   Procedure: VIDEO BRONCHOSCOPY WITH ENDOBRONCHIAL NAVIGATION with biopsies;  Surgeon: Kerrin Elspeth BROCKS, MD;  Location: [facility] OR;  Service: Thoracic;  Laterality: N/A;    OB History   No obstetric history on file.      Home Medications    Prior to Admission medications   Medication Sig Start Date End Date Taking? Authorizing Provider  acetaminophen (TYLENOL) 500 MG tablet Take 500 mg by mouth every 8 (eight) hours as needed for mild pain or headache.    [provider]  amLODipine (NORVASC) 10 MG tablet Take 10 mg by mouth daily.    [provider]  AMLODIPINE BESY-BENAZEPRIL HCL PO Take by mouth.    [provider]  Ascorbic Acid (VITAMIN C) 500 MG CAPS Take by mouth.    [provider]  atenolol (TENORMIN) 25 MG tablet Take 25 mg by mouth daily.     [provider]  CRANBERRY PO Take 4,200 mg by mouth daily.    [provider]  losartan  (COZAAR ) 50 MG tablet Take 50 mg by mouth daily.    [provider]  omeprazole (PRILOSEC) 40 MG capsule Take 40 mg by mouth daily before breakfast.     [provider]  pravastatin (PRAVACHOL) 20 MG tablet Take 20 mg by mouth at bedtime.  07/02/15   [provider]  pyridOXINE (VITAMIN B-6) 100 MG tablet Take 100 mg by mouth daily.    [provider]  sertraline (ZOLOFT) 50 MG tablet Take 25 mg by mouth 2 (two) times daily.    [provider]  triamcinolone cream (KENALOG) 0.1 %  04/04/19   [provider]  vitamin E 400 UNIT capsule Take 400 Units by mouth daily.    [provider]    Family History Family History  Problem Relation Age of Onset   Breast cancer Mother    Breast cancer Sister        x 2   Uterine cancer Other    Breast cancer Sister    Colon cancer Neg Hx     Social History Social History    Tobacco Use   Smoking status: Never    Passive exposure: Yes   Smokeless tobacco: Never   Tobacco comments:    Exposed at work 28 yrs.   Vaping Use   Vaping status: Never Used  Substance Use Topics   Alcohol use: Yes    Comment: occasional   Drug use: No     Allergies   Elemental sulfur, Propoxyphene n-acetaminophen, and Sulfonamide derivatives   Review of Systems Review of Systems  Neurological:  Positive for dizziness.   See HPI  Physical Exam Triage Vital Signs ED Triage Vitals  Encounter Vitals Group     BP 10/13/23 1544 (!) 170/3     Girls Systolic BP Percentile --      Girls Diastolic BP Percentile --      Boys Systolic BP Percentile --      Boys Diastolic BP Percentile --      Pulse Rate 10/13/23 1544 79     Resp 10/13/23 1544 20     Temp 10/13/23 1544 98.2 F (36.8 C)     Temp Source 10/13/23 1544 Oral     SpO2 10/13/23 1544 96 %     Weight --      Height --      Head Circumference --      Peak Flow --      Pain Score 10/13/23 1545 0     Pain Loc --      Pain Education --      Exclude from Growth Chart --    No data found.  Updated Vital Signs BP (!) 170/3 (BP Location: Right Arm) Comment: 169/71 left  Pulse 79   Temp 98.2 F (36.8 C) (Oral)   Resp 20   SpO2 96%   Visual Acuity Right Eye Distance:   Left Eye Distance:   Bilateral Distance:    Right Eye Near:   Left Eye Near:    Bilateral Near:     Physical Exam Vitals and nursing note reviewed.  Constitutional:      General: She is not in acute distress.    Appearance: Normal appearance. She is not ill-appearing, toxic-appearing or diaphoretic.  Pulmonary:     Effort: Pulmonary effort is normal.  Skin:    General: Skin is warm and dry.  Neurological:     General: No focal deficit present.     Mental Status: She is alert. Mental status is at baseline.     Cranial Nerves: No cranial  nerve deficit.     Sensory: No sensory deficit.     Motor: No weakness.     Coordination:  Coordination normal.     Gait: Gait normal.  Psychiatric:        Mood and Affect: Mood normal.      UC Treatments / Results  Labs (all labs ordered are listed, but only abnormal results are displayed) Labs Reviewed - No data to display  EKG   Radiology No results found.  Procedures Procedures (including critical care time)  Medications Ordered in UC Medications - No data to display  Initial Impression / Assessment and Plan / UC Course  I have reviewed the triage vital signs and the nursing notes.  Pertinent labs & imaging results that were available during my care of the patient were reviewed by me and considered in my medical decision making (see chart for details).     Hypertension and dizziness.  There is no concerns on exam.  Patient came here today with a family member wanting to adjust her medications.  Recommendations were to follow-up with her primary for adjustments in medications as we do not do this urgent care.  I did give her some suggestions.  Recommended meclizine for dizziness for vertigo type symptoms.  Low-dose at 12.5 mg  Also recommended splitting her blood pressure medication up in the morning and evening and adjusting her anxiety medication.  Ultimately she still needs to follow-up with her primary care doctor for management of all of these conditions.  Recommend increase fluids because she is not drinking enough water and decrease caffeine No concerns today or red flags.  No concern for CVA.  Final Clinical Impressions(s) / UC Diagnoses   Final diagnoses:  Primary hypertension  Dizziness     Discharge Instructions      Lets go with the plan that we talked about with management of your blood pressure and dizziness.  If your blood pressure goes over 200 and you are having dizziness or lightheadedness or vision changes you will need to go to the ER. Otherwise make sure you are drinking plenty of fluids, decreasing caffeine and monitoring your blood  pressure Please follow-up with your doctor for any continued issues    ED Prescriptions   None    PDMP not reviewed this encounter.   Adah Wilbert LABOR, FNP 10/14/23 [phone]

## 2023-11-16 ENCOUNTER — Other Ambulatory Visit: Payer: Self-pay

## 2023-11-16 DIAGNOSIS — I6523 Occlusion and stenosis of bilateral carotid arteries: Secondary | ICD-10-CM

## 2023-12-11 NOTE — Progress Notes (Unsigned)
 Office Note     CC:  follow up Requesting Provider:  Arloa Elsie SAUNDERS, MD  HPI: [name] is a 89 y.o. (1929/11/04) female who presents for surveillance follow up of carotid artery stenosis. We have been following her left ICA stenosis of 40-59%. She has no history of TIA or stroke. No prior carotid interventions.   Today she is here with her friend. She says overall she is feeling very good for her age. She says she has a sore tooth but is scheduled to see Dentist next week. Otherwise she denies any visual changes, slurred speech, facial drooping, unilateral upper or lower extremity weakness or numbness. She denies any pain in her legs on ambulation or rest, no tissue loss.   The pt is on a statin for cholesterol management.    The pt is not on an aspirin.  Other AC:  none The pt is on CCB, BB, ARB for hypertension.  The pt does not have diabetes. 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   Esophageal stricture    GERD (gastroesophageal reflux disease)    History of gastritis    History of phlebitis    History of radiation therapy 11/30/16-12/11/16   SBRT to right lung 50 Gy in 5 fractions   Hypertension    Lung cancer (HCC) dx'd 08/2016   Lung nodule    Right Upper   Osteoporosis    Pneumonia    PONV (postoperative nausea and vomiting)    UTI (urinary tract infection)     Past Surgical History:  Procedure Laterality Date   BREAST CYST EXCISION     left   CATARACT EXTRACTION     bilateral   CYSTECTOMY     Left knee   EYE SURGERY     KNEE ARTHROSCOPY     right/ Pt can not recall   VIDEO BRONCHOSCOPY WITH ENDOBRONCHIAL NAVIGATION N/A 10/25/2016   Procedure: VIDEO BRONCHOSCOPY WITH ENDOBRONCHIAL NAVIGATION with biopsies;  Surgeon: Kerrin Elspeth BROCKS, MD;  Location: [facility] OR;  Service: Thoracic;  Laterality: N/A;    Social History   Socioeconomic History   Marital status: Widowed    Spouse name: Not on file   Number of children: 0   Years of education: Not on file   Highest education level: Not on file  Occupational History   Occupation: retired  Tobacco Use   Smoking status: Never    Passive exposure: Yes   Smokeless tobacco: Never   Tobacco comments:    Exposed at work 28 yrs.   Vaping Use   Vaping status: Never Used  Substance and Sexual Activity   Alcohol use: Yes    Comment: occasional   Drug use: No   Sexual activity: Not on file  Other Topics Concern   Not on file  Social History Narrative   Not on file   Social Drivers of Health   Financial Resource Strain: Not on file  Food Insecurity: Not on file  Transportation Needs: Not on file  Physical Activity: Not on file  Stress: Not on file  Social Connections: Not on file  Intimate Partner Violence: Not on file    Family History  Problem Relation Age of Onset  Breast cancer Mother    Breast cancer Sister        x 2   Uterine cancer Other    Breast cancer Sister    Colon cancer Neg Hx     Current Outpatient Medications  Medication Sig Dispense Refill   losartan  (COZAAR ) 50 MG tablet Take 50 mg by mouth daily. (Patient taking differently: Take 100 mg by mouth daily.)     acetaminophen (TYLENOL) 500 MG tablet Take 500 mg by mouth every 8 (eight) hours as needed for mild pain or headache.     amLODipine (NORVASC) 10 MG tablet Take 10 mg by mouth daily.     AMLODIPINE BESY-BENAZEPRIL HCL PO Take by mouth.     Ascorbic Acid (VITAMIN C) 500 MG CAPS Take by mouth.     atenolol (TENORMIN) 25 MG tablet Take 25 mg by mouth daily.      CRANBERRY PO Take 4,200 mg by mouth daily.     omeprazole (PRILOSEC) 40 MG capsule Take 40 mg by mouth daily before breakfast.      pravastatin (PRAVACHOL) 20 MG tablet Take 20 mg by mouth at bedtime.      pyridOXINE  (VITAMIN B-6) 100 MG tablet Take 100 mg by mouth daily.     sertraline (ZOLOFT) 50 MG tablet Take 25 mg by mouth 2 (two) times daily.     triamcinolone cream (KENALOG) 0.1 %  (Patient not taking: Reported on 06/21/2023)     vitamin E 400 UNIT capsule Take 400 Units by mouth daily.     No current facility-administered medications for this visit.    Allergies  Allergen Reactions   Elemental Sulfur Other (See Comments)    Severe headache and blurry vision   Propoxyphene N-Acetaminophen Other (See Comments)    Unknown    Sulfonamide Derivatives Other (See Comments)    headaches     REVIEW OF SYSTEMS:  Negative unless noted in HPI [X]  denotes positive finding, [ ]  denotes negative finding Cardiac  Comments:  Chest pain or chest pressure:    Shortness of breath upon exertion:    Short of breath when lying flat:    Irregular heart rhythm:        Vascular    Pain in calf, thigh, or hip brought on by ambulation:    Pain in feet at night that wakes you up from your sleep:     Blood clot in your veins:    Leg swelling:         Pulmonary    Oxygen at home:    Productive cough:     Wheezing:         Neurologic    Sudden weakness in arms or legs:     Sudden numbness in arms or legs:     Sudden onset of difficulty speaking or slurred speech:    Temporary loss of vision in one eye:     Problems with dizziness:         Gastrointestinal    Blood in stool:     Vomited blood:         Genitourinary    Burning when urinating:     Blood in urine:        Psychiatric    Major depression:         Hematologic    Bleeding problems:    Problems with blood clotting too easily:        Skin    Rashes or ulcers:  Constitutional    Fever or chills:      PHYSICAL EXAMINATION:  Vitals:   12/13/23 1411 12/13/23 1413  BP: (!) 178/73 (!) 166/77  Pulse: 88 94  Temp: 98 F (36.7 C)   TempSrc: Temporal   Weight: 95 lb 11.2 oz (43.4 kg)     General:  WDWN in NAD; vital signs  documented above Gait: Not observed HENT: WNL, normocephalic Pulmonary: normal non-labored breathing Cardiac: regular HR; left carotid bruit Abdomen: soft Vascular Exam/Pulses: 2+ radial, 2+ DP pulses bilaterally. Moving all extremities without deficits Extremities: without ischemic changes, without Gangrene , without cellulitis; without open wounds;  Musculoskeletal: no muscle wasting or atrophy  Neurologic: A&O X 3 Psychiatric:  The pt has Normal affect.   Non-Invasive Vascular Imaging:   VAS US  Carotid duplex: Summary:  Right Carotid: Velocities in the right ICA are consistent with a 1-39% stenosis. The ECA appears >50% stenosed.   Left Carotid: Velocities in the left ICA are consistent with a 40-59% stenosis. The ECA appears >50% stenosed.   Vertebrals:  Left vertebral artery demonstrates antegrade flow. Atypical antegrade flow in the right vertebral artery.  Subclavians: Right subclavian artery flow was disturbed. Normal flow hemodynamics were seen in the left subclavian artery.   ASSESSMENT/PLAN:: 89 y.o. female here for follow up for carotid artery stenosis. We have been following her left ICA stenosis of 40-59%. She has no history of TIA or stroke. No prior carotid interventions. She remains without any associated symptoms.  - Duplex today is stable. She has 1-39% right ICA stenosis, and 40-59% left ICA stenosis - continue Statin  - she will follow up in 1 year with carotid duplex   Teretha Damme, PA-C [facility] [phone]  Clinic MD:   [place]

## 2023-12-13 ENCOUNTER — Ambulatory Visit (HOSPITAL_COMMUNITY)
Admission: RE | Admit: 2023-12-13 | Discharge: 2023-12-13 | Disposition: A | Discharge status: Home / Self Care | Source: Ambulatory Visit | Attending: Vascular Surgery | Admitting: Vascular Surgery

## 2023-12-13 ENCOUNTER — Ambulatory Visit: Admitting: Physician Assistant

## 2023-12-13 ENCOUNTER — Encounter: Payer: Self-pay | Admitting: Physician Assistant

## 2023-12-13 VITALS — BP 166/77 | HR 94 | Temp 98.0°F | Wt 95.7 lb

## 2023-12-13 DIAGNOSIS — I6523 Occlusion and stenosis of bilateral carotid arteries: Secondary | ICD-10-CM | POA: Diagnosis present

## 2023-12-13 DIAGNOSIS — I6522 Occlusion and stenosis of left carotid artery: Secondary | ICD-10-CM

## 2024-02-12 ENCOUNTER — Telehealth: Payer: Self-pay | Admitting: *Deleted

## 2024-02-12 NOTE — Telephone Encounter (Signed)
 CALLED PATIENT TO INFORM OF CT FOR 06-20-24- ARRIVAL TIME- 12:45 PM @ [facility], NO RESTRICTIONS TO SCAN, PATIENT TO RECEIVE RESULTS FROM DR. KINARD ON 06-26-24  @ 11 AM FOR RESULTS, LVM FOR A RETURN CALL

## 2024-06-20 ENCOUNTER — Other Ambulatory Visit (HOSPITAL_COMMUNITY)

## 2024-06-26 ENCOUNTER — Ambulatory Visit: Payer: Self-pay | Admitting: Radiation Oncology
